# Patient Record
Sex: Male | Born: 1940 | Race: White | Hispanic: No | Marital: Married | Smoking: Former smoker
Health system: Southern US, Community
[De-identification: ages and names within clinical notes are randomized; demographics above are authoritative.]

## PROBLEM LIST (undated history)

## (undated) DIAGNOSIS — Z8669 Personal history of other diseases of the nervous system and sense organs: Secondary | ICD-10-CM

## (undated) DIAGNOSIS — K219 Gastro-esophageal reflux disease without esophagitis: Secondary | ICD-10-CM

## (undated) DIAGNOSIS — T4145XA Adverse effect of unspecified anesthetic, initial encounter: Secondary | ICD-10-CM

## (undated) DIAGNOSIS — G4730 Sleep apnea, unspecified: Secondary | ICD-10-CM

## (undated) DIAGNOSIS — G629 Polyneuropathy, unspecified: Secondary | ICD-10-CM

## (undated) DIAGNOSIS — T8859XA Other complications of anesthesia, initial encounter: Secondary | ICD-10-CM

## (undated) DIAGNOSIS — C801 Malignant (primary) neoplasm, unspecified: Secondary | ICD-10-CM

## (undated) DIAGNOSIS — E119 Type 2 diabetes mellitus without complications: Secondary | ICD-10-CM

## (undated) DIAGNOSIS — I2510 Atherosclerotic heart disease of native coronary artery without angina pectoris: Secondary | ICD-10-CM

## (undated) DIAGNOSIS — I214 Non-ST elevation (NSTEMI) myocardial infarction: Secondary | ICD-10-CM

## (undated) DIAGNOSIS — I10 Essential (primary) hypertension: Secondary | ICD-10-CM

## (undated) DIAGNOSIS — Z9889 Other specified postprocedural states: Secondary | ICD-10-CM

## (undated) DIAGNOSIS — M1990 Unspecified osteoarthritis, unspecified site: Secondary | ICD-10-CM

## (undated) HISTORY — PX: CHOLECYSTECTOMY: SHX55

## (undated) HISTORY — PX: CATARACT EXTRACTION: SUR2

## (undated) HISTORY — PX: LEG SURGERY: SHX1003

## (undated) HISTORY — PX: TOTAL KNEE ARTHROPLASTY: SHX125

## (undated) HISTORY — PX: CORONARY STENT PLACEMENT: SHX1402

---

## 2015-09-01 ENCOUNTER — Emergency Department (HOSPITAL_COMMUNITY): Payer: Medicare Other

## 2015-09-01 ENCOUNTER — Observation Stay (HOSPITAL_COMMUNITY)
Admission: EM | Admit: 2015-09-01 | Discharge: 2015-09-01 | Disposition: A | Discharge status: Home / Self Care | Payer: Medicare Other | Attending: Family Medicine | Admitting: Family Medicine

## 2015-09-01 ENCOUNTER — Encounter (HOSPITAL_COMMUNITY): Payer: Self-pay | Admitting: Emergency Medicine

## 2015-09-01 DIAGNOSIS — E118 Type 2 diabetes mellitus with unspecified complications: Secondary | ICD-10-CM

## 2015-09-01 DIAGNOSIS — I25119 Atherosclerotic heart disease of native coronary artery with unspecified angina pectoris: Secondary | ICD-10-CM | POA: Diagnosis not present

## 2015-09-01 DIAGNOSIS — I10 Essential (primary) hypertension: Secondary | ICD-10-CM

## 2015-09-01 DIAGNOSIS — R079 Chest pain, unspecified: Secondary | ICD-10-CM | POA: Diagnosis present

## 2015-09-01 DIAGNOSIS — E785 Hyperlipidemia, unspecified: Secondary | ICD-10-CM | POA: Diagnosis not present

## 2015-09-01 HISTORY — DX: Sleep apnea, unspecified: G47.30

## 2015-09-01 HISTORY — DX: Atherosclerotic heart disease of native coronary artery without angina pectoris: I25.10

## 2015-09-01 HISTORY — DX: Other complications of anesthesia, initial encounter: T88.59XA

## 2015-09-01 HISTORY — DX: Type 2 diabetes mellitus without complications: E11.9

## 2015-09-01 HISTORY — DX: Non-ST elevation (NSTEMI) myocardial infarction: I21.4

## 2015-09-01 HISTORY — DX: Gastro-esophageal reflux disease without esophagitis: K21.9

## 2015-09-01 HISTORY — DX: Personal history of other diseases of the nervous system and sense organs: Z98.890

## 2015-09-01 HISTORY — DX: Essential (primary) hypertension: I10

## 2015-09-01 HISTORY — DX: Unspecified osteoarthritis, unspecified site: M19.90

## 2015-09-01 HISTORY — DX: Personal history of other diseases of the nervous system and sense organs: Z86.69

## 2015-09-01 HISTORY — DX: Adverse effect of unspecified anesthetic, initial encounter: T41.45XA

## 2015-09-01 HISTORY — DX: Polyneuropathy, unspecified: G62.9

## 2015-09-01 HISTORY — DX: Malignant (primary) neoplasm, unspecified: C80.1

## 2015-09-01 LAB — CREATININE, SERUM: Creatinine, Ser: 0.87 mg/dL (ref 0.61–1.24)

## 2015-09-01 LAB — CBC
HCT: 41.2 % (ref 39.0–52.0)
HCT: 40.1 % (ref 39.0–52.0)
Hemoglobin: 15.2 g/dL (ref 13.0–17.0)
Hemoglobin: 14.3 g/dL (ref 13.0–17.0)
MCH: 33.8 pg (ref 26.0–34.0)
MCH: 32.9 pg (ref 26.0–34.0)
MCHC: 36.9 g/dL — ABNORMAL HIGH (ref 30.0–36.0)
MCHC: 35.7 g/dL (ref 30.0–36.0)
MCV: 91.6 fL (ref 78.0–100.0)
MCV: 92.4 fL (ref 78.0–100.0)
PLATELETS: 140 10*3/uL — AB (ref 150–400)
Platelets: 128 10*3/uL — ABNORMAL LOW (ref 150–400)
RBC: 4.5 MIL/uL (ref 4.22–5.81)
RBC: 4.34 MIL/uL (ref 4.22–5.81)
RDW: 12.4 % (ref 11.5–15.5)
RDW: 12.5 % (ref 11.5–15.5)
WBC: 5.8 10*3/uL (ref 4.0–10.5)
WBC: 5.6 10*3/uL (ref 4.0–10.5)

## 2015-09-01 LAB — BASIC METABOLIC PANEL
Anion gap: 11 (ref 5–15)
BUN: 15 mg/dL (ref 6–20)
CHLORIDE: 104 mmol/L (ref 101–111)
CO2: 22 mmol/L (ref 22–32)
CREATININE: 0.87 mg/dL (ref 0.61–1.24)
Calcium: 10 mg/dL (ref 8.9–10.3)
GFR calc Af Amer: >60 mL/min (ref >60)
GFR calc non Af Amer: >60 mL/min (ref >60)
Glucose, Bld: 239 mg/dL — ABNORMAL HIGH (ref 65–99)
Potassium: 4.1 mmol/L (ref 3.5–5.1)
SODIUM: 137 mmol/L (ref 135–145)

## 2015-09-01 LAB — TROPONIN I
Troponin I: <0.03 ng/mL (ref <0.031)
Troponin I: <0.03 ng/mL (ref <0.031)

## 2015-09-01 LAB — GLUCOSE, CAPILLARY
GLUCOSE-CAPILLARY: 264 mg/dL — AB (ref 65–99)
Glucose-Capillary: 261 mg/dL — ABNORMAL HIGH (ref 65–99)

## 2015-09-01 IMAGING — DX DG CHEST 2V
2 series · 2 of 2 positions shown · non-contrast
Comparison: None.

CLINICAL DATA: 74-year-old male with chest pain and shortness of
breath

EXAM:
CHEST  2 VIEW

[chest pa]
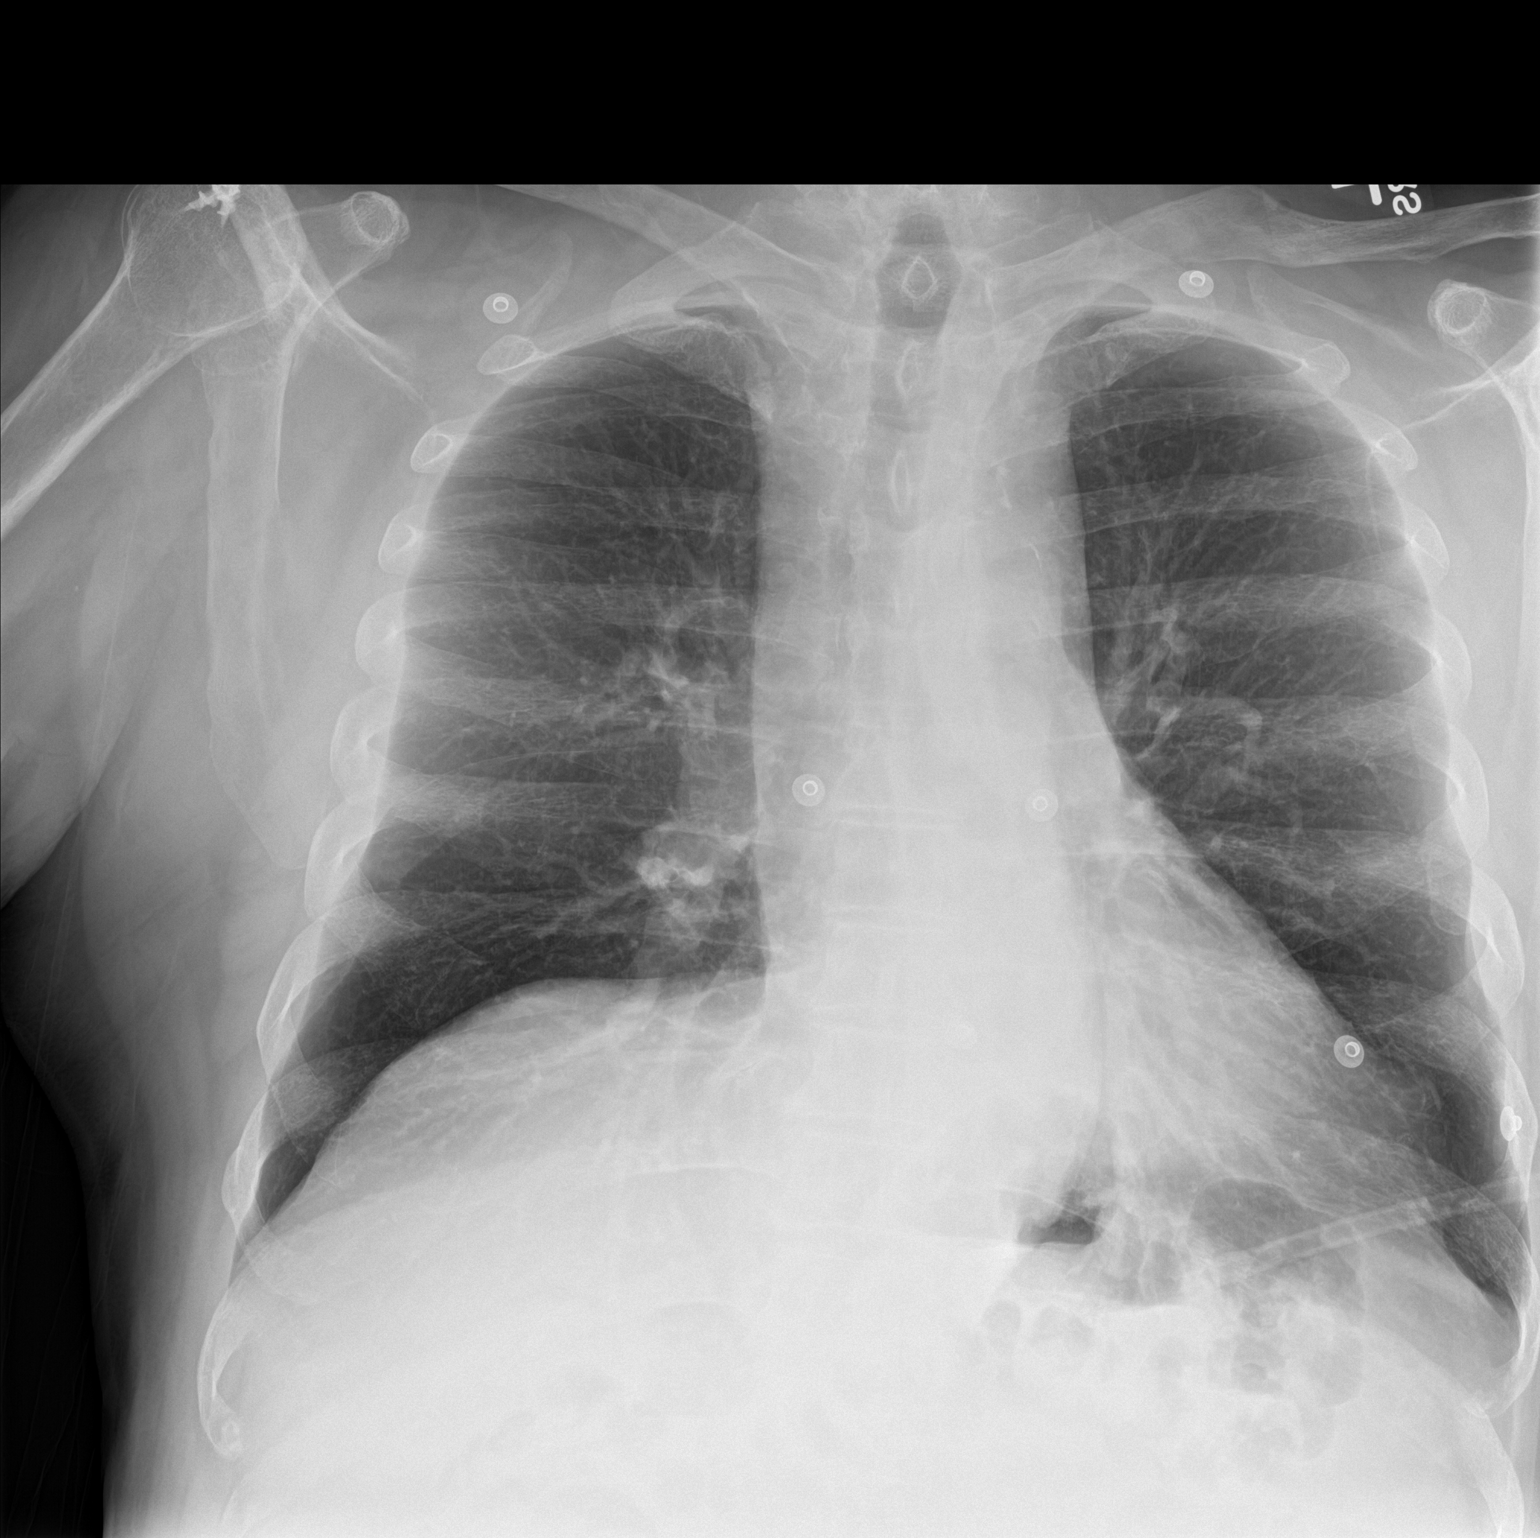

[chest lat]
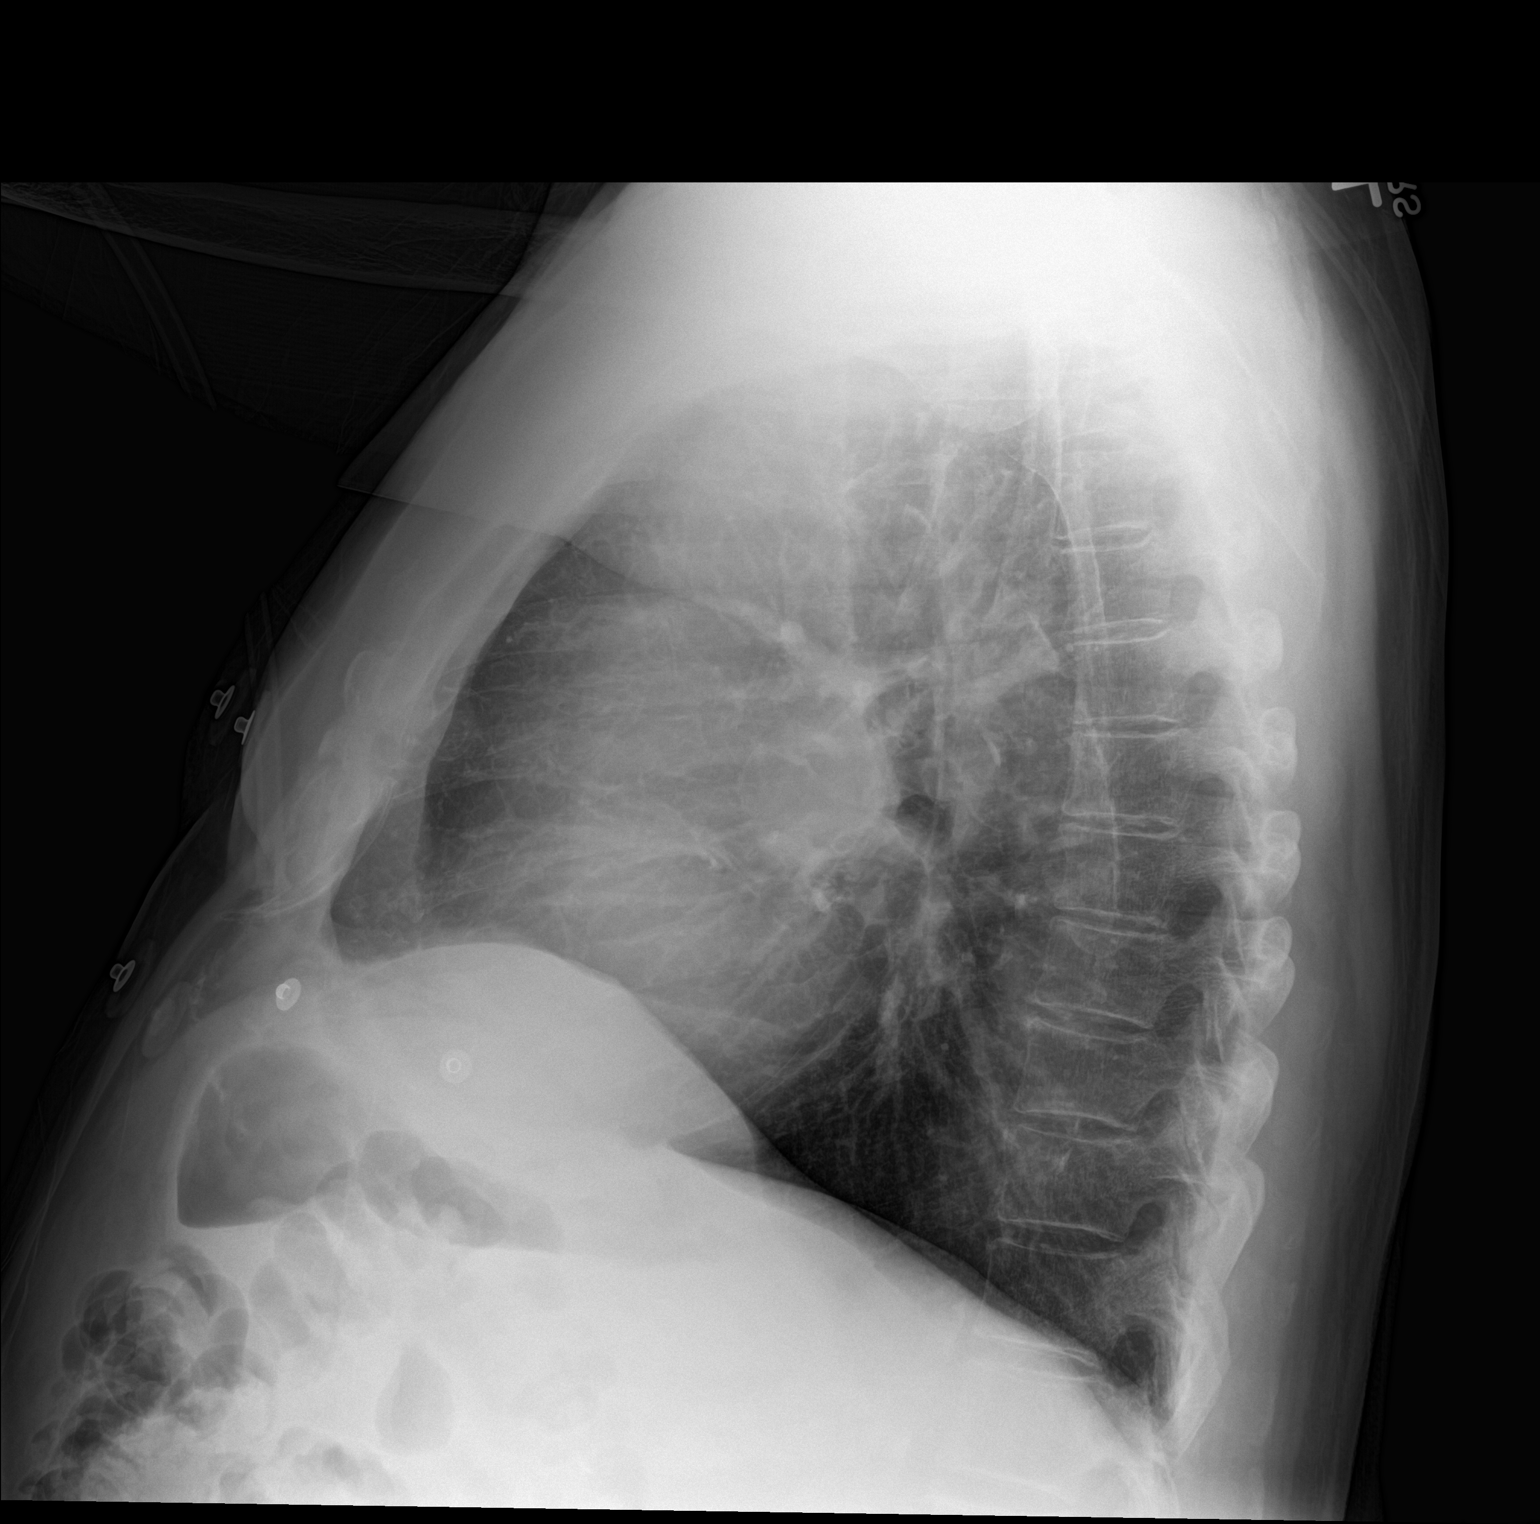

[2 of 2 positions shown; findings below may reference images not displayed]

FINDINGS: The heart size and mediastinal contours are within normal limits.
Both lungs are clear. The visualized skeletal structures are
unremarkable.
IMPRESSION: No active cardiopulmonary disease.

## 2015-09-01 MED ORDER — ZOLPIDEM TARTRATE 5 MG PO TABS: 5.0000 mg | ORAL_TABLET | Freq: Every evening | ORAL | Status: DC | PRN

## 2015-09-01 MED ORDER — ACETAMINOPHEN 325 MG PO TABS: 650.0000 mg | ORAL_TABLET | ORAL | Status: DC | PRN

## 2015-09-01 MED ORDER — LISINOPRIL 5 MG PO TABS
5.0000 mg | ORAL_TABLET | Freq: Every day | ORAL | Status: DC
  Administered 2015-09-01: 5 mg via ORAL
  Filled 2015-09-01: qty 1

## 2015-09-01 MED ORDER — ACETAMINOPHEN 650 MG RE SUPP: 650.0000 mg | Freq: Four times a day (QID) | RECTAL | Status: DC | PRN

## 2015-09-01 MED ORDER — SODIUM CHLORIDE 0.9 % IJ SOLN: 3.0000 mL | Freq: Two times a day (BID) | INTRAMUSCULAR | Status: DC

## 2015-09-01 MED ORDER — ONDANSETRON HCL 4 MG/2ML IJ SOLN: 4.0000 mg | Freq: Four times a day (QID) | INTRAMUSCULAR | Status: DC | PRN

## 2015-09-01 MED ORDER — SODIUM CHLORIDE 0.9 % IV SOLN: 250.0000 mL | INTRAVENOUS | Status: DC | PRN

## 2015-09-01 MED ORDER — ONDANSETRON HCL 4 MG PO TABS: 4.0000 mg | ORAL_TABLET | Freq: Four times a day (QID) | ORAL | Status: DC | PRN

## 2015-09-01 MED ORDER — ENOXAPARIN SODIUM 40 MG/0.4ML ~~LOC~~ SOLN
40.0000 mg | SUBCUTANEOUS | Status: DC
  Administered 2015-09-01: 40 mg via SUBCUTANEOUS
  Filled 2015-09-01: qty 0.4

## 2015-09-01 MED ORDER — FINASTERIDE 5 MG PO TABS
5.0000 mg | ORAL_TABLET | Freq: Every day | ORAL | Status: DC
  Administered 2015-09-01: 5 mg via ORAL
  Filled 2015-09-01: qty 1

## 2015-09-01 MED ORDER — ENOXAPARIN SODIUM 40 MG/0.4ML ~~LOC~~ SOLN: 40.0000 mg | SUBCUTANEOUS | Status: DC

## 2015-09-01 MED ORDER — ACETAMINOPHEN 325 MG PO TABS
650.0000 mg | ORAL_TABLET | Freq: Four times a day (QID) | ORAL | Status: DC | PRN
Start: 2015-09-01 — End: 2015-09-01

## 2015-09-01 MED ORDER — SODIUM CHLORIDE 0.9 % IJ SOLN
3.0000 mL | Freq: Two times a day (BID) | INTRAMUSCULAR | Status: DC
  Administered 2015-09-01: 3 mL via INTRAVENOUS

## 2015-09-01 MED ORDER — ALUM & MAG HYDROXIDE-SIMETH 200-200-20 MG/5ML PO SUSP: 30.0000 mL | Freq: Four times a day (QID) | ORAL | Status: DC | PRN

## 2015-09-01 MED ORDER — ASPIRIN EC 81 MG PO TBEC
81.0000 mg | DELAYED_RELEASE_TABLET | Freq: Every day | ORAL | Status: DC
  Administered 2015-09-01: 81 mg via ORAL
  Filled 2015-09-01: qty 1

## 2015-09-01 MED ORDER — INSULIN ASPART 100 UNIT/ML ~~LOC~~ SOLN
0.0000 [IU] | SUBCUTANEOUS | Status: DC
  Administered 2015-09-01: 12 [IU] via SUBCUTANEOUS

## 2015-09-01 MED ORDER — MORPHINE SULFATE (PF) 4 MG/ML IV SOLN
4.0000 mg | INTRAVENOUS | Status: DC | PRN
Start: 2015-09-01 — End: 2015-09-01

## 2015-09-01 MED ORDER — NITROGLYCERIN 0.4 MG SL SUBL: 0.4000 mg | SUBLINGUAL_TABLET | SUBLINGUAL | Status: DC | PRN

## 2015-09-01 MED ORDER — SODIUM CHLORIDE 0.9 % IJ SOLN: 3.0000 mL | INTRAMUSCULAR | Status: DC | PRN

## 2015-09-01 MED ORDER — CLOPIDOGREL BISULFATE 75 MG PO TABS
75.0000 mg | ORAL_TABLET | Freq: Every day | ORAL | Status: DC
  Administered 2015-09-01: 75 mg via ORAL
  Filled 2015-09-01: qty 1

## 2015-09-01 MED ORDER — DONEPEZIL HCL 10 MG PO TABS: 10.0000 mg | ORAL_TABLET | Freq: Every day | ORAL | Status: DC

## 2015-09-01 NOTE — Progress Notes (Signed)
Inpatient Diabetes Program Recommendations  AACE/ADA: New Consensus Statement on Inpatient Glycemic Control (2015)  Target Ranges:  Prepandial:   less than 140 mg/dL      Peak postprandial:   less than 180 mg/dL (1-2 hours)      Critically ill patients:  140 - 180 mg/dL   Results for ANDWELE, SIDDONS (MRN [id number]) as of 09/01/2015 13:46  Ref. Range 09/01/2015 11:36  Glucose-Capillary Latest Ref Range: 65-99 mg/dL 261 (H)   Review of Glycemic Control  Diabetes history: DM 2 Outpatient Diabetes medications: 75/25 60 units BID, Metformin 1,000 mg BID Current orders for Inpatient glycemic control: Novolog 0-24 units TID  Inpatient Diabetes Program Recommendations: Insulin - Basal: Patient is on 75/25 at home 60 units BID ( Equivalent basal  component 90 units, short acting component 30 units), Please consider ordering a portion of basal insulin while the patient is in the hospital, Lantus 45 units Q24 hours.  Please change Novolog correction scale from the cardiac surgery scale to the regular glycemic control order set and order Novolog Moderate correction TID with HS scale.  Thanks,  Tama Headings RN, MSN, [facility] Inpatient Diabetes Coordinator Team Pager [phone] (8a-5p)

## 2015-09-01 NOTE — ED Notes (Signed)
Per pt, he was woken out of a sleep w/ chest pressure (6-10 pain).  He took 650mg  ASA and 2 nitro w/ little relief.  No hx of MI, has had 2 stints placed.  Pressure is now rated at a 1 out of 10.

## 2015-09-01 NOTE — Plan of Care (Signed)
Problem: Safety: Goal: Ability to remain free from injury will improve Outcome: Completed/Met Date Met:  09/01/15 Pt educated on safety measures put into place. Pt verbalized understanding.

## 2015-09-01 NOTE — H&P (Signed)
Leslie Meadow, PA-C Physician Assistant Signed Internal Medicine Progress Notes 09/01/2015 7:29 AM    Expand All Collapse All    [facility] History and Physical  Leslie [name] [id number] DOB: December 02, 1940 DOA: 09/01/2015  Referring physician: Seward Speck PCP: Wallene Dales, MD   Chief Complaint: Chest pain  HPI: Leslie [name] is a 75 y.o. male. He reports awakening with chest discomfort. Pt describes pain as being a level 2 behind his sternum. Pt reports some soreness. Pt reports His wife gave him aspirin and he was given 2 nitroglycerin by EMS. Pt reports he is pain free. Pt reports nothing made pain worse. Pt has a history of coronary artery disease. He has 2 stents that were plced in [facility] in the past. He was last seen by his Cardiologist and had a chek up 1 year ago at [facility]. Pt has a history of hypertension and and diabtes.     Review of Systems:  Constitutional:  No weight loss, night sweats, Fevers, chills, fatigue.  HEENT:  No headaches, Difficulty swallowing,Tooth/dental problems,Sore throat,  No sneezing, itching, ear ache, nasal congestion, post nasal drip,  Cardio-vascular:  chest pain, Orthopnea, PND, swelling in lower extremities, anasarca, dizziness, palpitations  GI:  No heartburn, indigestion, abdominal pain, nausea, vomiting, diarrhea, change in bowel habits, loss of appetite  Resp:  No shortness of breath with exertion or at rest. No excess mucus, no productive cough, No non-productive cough, No coughing up of blood.No change in color of mucus.No wheezing.No chest wall deformity  Skin:  no rash or lesions.  GU:  no dysuria, change in color of urine, no urgency or frequency. No flank pain.  Musculoskeletal:  No joint pain or swelling. No decreased range of motion. No back pain.  Psych:  No change in mood or affect. No depression or anxiety. No memory loss.   Past Medical History  Diagnosis Date  .  Diabetes mellitus without complication ([place])   . Hypertension    History reviewed. No pertinent past surgical history. Social History:  reports that he quit smoking about 22 years ago. His smoking use included Cigarettes. He does not have any smokeless tobacco history on file. His alcohol and drug histories are not on file.  No Known Allergies  No family history on file.  Mother Stomach cancer Brother Testicular cancer.  Prior to Admission medications   Medication Sig Start Date End Date Taking? Authorizing Provider  aspirin EC 81 MG tablet Take 81 mg by mouth daily.   Yes Historical Provider, MD  ATORVASTATIN CALCIUM PO Take 1 tablet by mouth daily.   Yes Historical Provider, MD  clopidogrel (PLAVIX) 75 MG tablet Take 75 mg by mouth daily.   Yes Historical Provider, MD  donepezil (ARICEPT) 10 MG tablet Take 10 mg by mouth at bedtime. Reported on 09/01/2015   Yes Historical Provider, MD  finasteride (PROSCAR) 5 MG tablet Take 5 mg by mouth daily.   Yes Historical Provider, MD  insulin lispro protamine-lispro (HUMALOG 75/25 MIX) (75-25) 100 UNIT/ML SUSP injection Inject 60 Units into the skin 2 (two) times daily.   Yes Historical Provider, MD  lisinopril (PRINIVIL,ZESTRIL) 5 MG tablet Take 5 mg by mouth daily.   Yes Historical Provider, MD  metFORMIN (GLUCOPHAGE) 500 MG tablet Take 1,000 mg by mouth 2 (two) times daily with a meal.   Yes Historical Provider, MD  zaleplon (SONATA) 10 MG capsule Take 10 mg by mouth at bedtime.   Yes Historical Provider, MD   Physical Exam:  Filed Vitals:   09/01/15 0600 09/01/15 0615 09/01/15 0630 09/01/15 0645  BP: 132/74 141/67 137/68 121/74  Pulse: 73 71 63 68  Temp:      TempSrc:      Resp: 17 15 14 18   Height:      Weight:      SpO2: 97% 98% 98% 97%    Wt Readings from Last 3 Encounters:  09/01/15 200 lb (90.719 kg)      General: Appears calm and comfortable  Eyes: PERRL, normal lids, irises & conjunctiva  ENT: grossly normal hearing, lips & tongue  Neck: no LAD, masses or thyromegaly  Cardiovascular: RRR, no m/r/g. No LE edema.  Telemetry: SR, no arrhythmias   Respiratory: CTA bilaterally, no w/r/r. Normal respiratory effort.  Abdomen: soft, ntnd  Skin: no rash or induration seen on limited exam  Musculoskeletal: grossly normal tone BUE/BLE  Psychiatric: grossly normal mood and affect, speech fluent and appropriate  Neurologic: grossly non-focal.          Labs on Admission:  Basic Metabolic Panel:  Last Labs      Recent Labs Lab 09/01/15 0503  NA 137  K 4.1  CL 104  CO2 22  GLUCOSE 239*  BUN 15  CREATININE 0.87  CALCIUM 10.0     Liver Function Tests:  Last Labs     No results for input(s): AST, ALT, ALKPHOS, BILITOT, PROT, ALBUMIN in the last 168 hours.    Last Labs     No results for input(s): LIPASE, AMYLASE in the last 168 hours.    Last Labs     No results for input(s): AMMONIA in the last 168 hours.   CBC:  Last Labs      Recent Labs Lab 09/01/15 0503  WBC 5.8  HGB 15.2  HCT 41.2  MCV 91.6  PLT 140*     Cardiac Enzymes:  Last Labs      Recent Labs Lab 09/01/15 0503  TROPONINI <0.03      BNP (last 3 results)  Recent Labs (within last 365 days)    No results for input(s): BNP in the last 8760 hours.    ProBNP (last 3 results)  Recent Labs (within last 365 days)    No results for input(s): PROBNP in the last 8760 hours.    CBG:  Last Labs     No results for input(s): GLUCAP in the last 168 hours.    Radiological Exams on Admission:  Imaging Results (Last 48 hours)    Dg Chest 2 View  09/01/2015 CLINICAL DATA: 75 year old male with chest pain and shortness of breath EXAM: CHEST 2 VIEW COMPARISON: None. FINDINGS: The heart size and mediastinal contours are within normal limits. Both  lungs are clear. The visualized skeletal structures are unremarkable. IMPRESSION: No active cardiopulmonary disease. Electronically Signed By: Anner Crete M.D. On: 09/01/2015 06:05     EKG: Independently reviewed.   Assessment/Plan Active Problems:  Pain in the chest   1. Chest Pain. Pt's heart score is 4.  EKg without evidence of acs. Troponin normal Chest xray non acute  Follow troponins to rule out MI. Pt is currently pain free. Pt is followed by Cardiology in [place].  2. Hypertension continue lisinopril. Monitor blood pressue 3. Diabetes. Continue home 75/25  4. Elevated cholesterol,  Continue current medications.   code Status: no code  DVT Prophylaxis:lovenox Family Communication: wife at bedside Plan admit for evaluation  Time spent: [facility] [phone]  Amion  7p-7a Password [email or web address]

## 2015-09-01 NOTE — ED Provider Notes (Signed)
CSN: [id number]     Arrival date & time 09/01/15  0434 History   First MD Initiated Contact with Patient 09/01/15 [phone]     Chief Complaint  Patient presents with  . Chest Pain     (Consider location/radiation/quality/duration/timing/severity/associated sxs/prior Treatment) The history is provided by the patient.   75 year old was awakened by retrosternal chest pressure. There is no associated dyspnea, nausea, diaphoresis. He does have a history of having 2 coronary stents placed but cannot recall if what he had tonight was similar to what he had previously. He took 2 aspirin at home and called for name brand. He thinks EMS made given him some nitroglycerin but is not sure. He tells me his pressure feeling was rated 3/10 at home and 1/10 currently. Nothing seems to make it better nothing seems to make it worse.  No past medical history on file. No past surgical history on file. No family history on file. Social History  Substance Use Topics  . Smoking status: Not on file  . Smokeless tobacco: Not on file  . Alcohol Use: Not on file    Review of Systems  All other systems reviewed and are negative.     Allergies  Review of patient's allergies indicates not on file.  Home Medications   Prior to Admission medications   Not on File   BP 138/64 mmHg  Pulse 68  Temp(Src) 98 F (36.7 C) (Oral)  Resp 18  Ht 5\' 6"  (1.676 m)  Wt 200 lb (90.719 kg)  BMI 32.30 kg/m2  SpO2 100% Physical Exam  Nursing note and vitals reviewed.  75 year old male, resting comfortably and in no acute distress. Vital signs are normal. Oxygen saturation is 100%, which is normal. Head is normocephalic and atraumatic. PERRLA, EOMI. Oropharynx is clear. Neck is nontender and supple without adenopathy or JVD. Back is nontender and there is no CVA tenderness. Lungs are clear without rales, wheezes, or rhonchi. Chest is nontender. Heart has regular rate and rhythm without murmur. Abdomen is soft, flat,  nontender without masses or hepatosplenomegaly and peristalsis is normoactive. Extremities have no cyanosis or edema, full range of motion is present. Skin is warm and dry without rash. Neurologic: Mental status is normal, cranial nerves are intact, there are no motor or sensory deficits.  ED Course  Procedures (including critical care time) Labs Review Results for orders placed or performed during the hospital encounter of [id number]  Basic metabolic panel  Result Value Ref Range   Sodium 137 135 - 145 mmol/L   Potassium 4.1 3.5 - 5.1 mmol/L   Chloride 104 101 - 111 mmol/L   CO2 22 22 - 32 mmol/L   Glucose, Bld 239 (H) 65 - 99 mg/dL   BUN 15 6 - 20 mg/dL   Creatinine, Ser 0.87 0.61 - 1.24 mg/dL   Calcium 10.0 8.9 - 10.3 mg/dL   GFR calc non Af Amer >60 >60 mL/min   GFR calc Af Amer >60 >60 mL/min   Anion gap 11 5 - 15  CBC  Result Value Ref Range   WBC 5.8 4.0 - 10.5 K/uL   RBC 4.50 4.22 - 5.81 MIL/uL   Hemoglobin 15.2 13.0 - 17.0 g/dL   HCT 41.2 39.0 - 52.0 %   MCV 91.6 78.0 - 100.0 fL   MCH 33.8 26.0 - 34.0 pg   MCHC 36.9 (H) 30.0 - 36.0 g/dL   RDW 12.4 11.5 - 15.5 %   Platelets 140 (L) 150 -  400 K/uL  Troponin I  Result Value Ref Range   Troponin I <0.03 <0.031 ng/mL   Imaging Review Dg Chest 2 View  09/01/2015  CLINICAL DATA:  75 year old male with chest pain and shortness of breath EXAM: CHEST  2 VIEW COMPARISON:  None. FINDINGS: The heart size and mediastinal contours are within normal limits. Both lungs are clear. The visualized skeletal structures are unremarkable. IMPRESSION: No active cardiopulmonary disease. Electronically Signed   By: Anner Crete M.D.   On: 09/01/2015 06:05   I have personally reviewed and evaluated these images and lab results as part of my medical decision-making.   EKG Interpretation   Date/Time:  Wednesday September 01 2015 04:43:30 EST Ventricular Rate:  63 PR Interval:  172 QRS Duration: 72 QT Interval:  404 QTC Calculation:  413 R Axis:   -70 Text Interpretation:  Normal sinus rhythm Left axis deviation Low voltage  QRS Inferior infarct , age undetermined Cannot rule out Anterior infarct ,  age undetermined Abnormal ECG No old tracing to compare Confirmed by [facility]   MD, Brigitta Pricer ([id number]) on 09/01/2015 4:50:14 AM      MDM   Final diagnoses:  Chest pain, unspecified  Coronary artery disease involving native coronary artery of native heart with angina pectoris (HCC)    Chest discomfort which may be cardiac in origin. He he has no prior records and the [facility] system. I was unable to find records for his stents in care everywhere.  Chest discomfort has completely resolved. Troponin and chest x-ray are unremarkable. Given his history of stent placement and nocturnal pain, he will be admitted for serial troponins. Case is discussed with Dr. Marily Memos of [facility] who agrees to admit the patient.  Delora Fuel, MD [id number] [phone]

## 2015-09-01 NOTE — Progress Notes (Signed)
[facility] History and Physical  Leslie [name] [id number] DOB: [birth year] DOA: 09/01/2015  Referring physician:  Seward Speck PCP: Wallene Dales, MD   Chief Complaint: Chest pain  HPI: Leslie [name] is a 75 y.o. male. He reports awakening with chest discomfort.  Pt describes pain as being a level 2 behind his sternum. Pt reports some soreness.  Pt reports His wife gave him aspirin and he was given 2 nitroglycerin by EMS.   Pt reports he is pain free. Pt reports nothing made pain worse. Pt has a history of coronary artery disease.  He has 2 stents that were plced in [facility] in the past.  He was last seen by his Cardiologist and had a chek up 1 year ago at [facility]. Pt has a history of hypertension and and diabtes.     Review of Systems:  Constitutional:  No weight loss, night sweats, Fevers, chills, fatigue.  HEENT:  No headaches, Difficulty swallowing,Tooth/dental problems,Sore throat,  No sneezing, itching, ear ache, nasal congestion, post nasal drip,  Cardio-vascular:   chest pain, Orthopnea, PND, swelling in lower extremities, anasarca, dizziness, palpitations  GI:  No heartburn, indigestion, abdominal pain, nausea, vomiting, diarrhea, change in bowel habits, loss of appetite  Resp:  No shortness of breath with exertion or at rest. No excess mucus, no productive cough, No non-productive cough, No coughing up of blood.No change in color of mucus.No wheezing.No chest wall deformity  Skin:  no rash or lesions.  GU:  no dysuria, change in color of urine, no urgency or frequency. No flank pain.  Musculoskeletal:  No joint pain or swelling. No decreased range of motion. No back pain.  Psych:  No change in mood or affect. No depression or anxiety. No memory loss.   Past Medical History  Diagnosis Date  . Diabetes mellitus without complication ([place])   . Hypertension    History reviewed. No pertinent past surgical history. Social History:  reports that he quit  smoking about 22 years ago. His smoking use included Cigarettes. He does not have any smokeless tobacco history on file. His alcohol and drug histories are not on file.  No Known Allergies  No family history on file.  Mother  Stomach cancer Brother Testicular cancer.  Prior to Admission medications   Medication Sig Start Date End Date Taking? Authorizing Provider  aspirin EC 81 MG tablet Take 81 mg by mouth daily.   Yes Historical Provider, MD  ATORVASTATIN CALCIUM PO Take 1 tablet by mouth daily.   Yes Historical Provider, MD  clopidogrel (PLAVIX) 75 MG tablet Take 75 mg by mouth daily.   Yes Historical Provider, MD  donepezil (ARICEPT) 10 MG tablet Take 10 mg by mouth at bedtime. Reported on 09/01/2015   Yes Historical Provider, MD  finasteride (PROSCAR) 5 MG tablet Take 5 mg by mouth daily.   Yes Historical Provider, MD  insulin lispro protamine-lispro (HUMALOG 75/25 MIX) (75-25) 100 UNIT/ML SUSP injection Inject 60 Units into the skin 2 (two) times daily.   Yes Historical Provider, MD  lisinopril (PRINIVIL,ZESTRIL) 5 MG tablet Take 5 mg by mouth daily.   Yes Historical Provider, MD  metFORMIN (GLUCOPHAGE) 500 MG tablet Take 1,000 mg by mouth 2 (two) times daily with a meal.   Yes Historical Provider, MD  zaleplon (SONATA) 10 MG capsule Take 10 mg by mouth at bedtime.   Yes Historical Provider, MD   Physical Exam: Filed Vitals:   09/01/15 0600 09/01/15 0615 09/01/15 0630 09/01/15 0645  BP: 132/74  141/67 137/68 121/74  Pulse: 73 71 63 68  Temp:      TempSrc:      Resp: 17 15 14 18   Height:      Weight:      SpO2: 97% 98% 98% 97%    Wt Readings from Last 3 Encounters:  09/01/15 200 lb (90.719 kg)    General:  Appears calm and comfortable Eyes: PERRL, normal lids, irises & conjunctiva ENT: grossly normal hearing, lips & tongue Neck: no LAD, masses or thyromegaly Cardiovascular: RRR, no m/r/g. No LE edema. Telemetry: SR, no arrhythmias  Respiratory: CTA bilaterally, no w/r/r.  Normal respiratory effort. Abdomen: soft, ntnd Skin: no rash or induration seen on limited exam Musculoskeletal: grossly normal tone BUE/BLE Psychiatric: grossly normal mood and affect, speech fluent and appropriate Neurologic: grossly non-focal.          Labs on Admission:  Basic Metabolic Panel:  Recent Labs Lab 09/01/15 0503  NA 137  K 4.1  CL 104  CO2 22  GLUCOSE 239*  BUN 15  CREATININE 0.87  CALCIUM 10.0   Liver Function Tests: No results for input(s): AST, ALT, ALKPHOS, BILITOT, PROT, ALBUMIN in the last 168 hours. No results for input(s): LIPASE, AMYLASE in the last 168 hours. No results for input(s): AMMONIA in the last 168 hours. CBC:  Recent Labs Lab 09/01/15 0503  WBC 5.8  HGB 15.2  HCT 41.2  MCV 91.6  PLT 140*   Cardiac Enzymes:  Recent Labs Lab 09/01/15 0503  TROPONINI <0.03    BNP (last 3 results) No results for input(s): BNP in the last 8760 hours.  ProBNP (last 3 results) No results for input(s): PROBNP in the last 8760 hours.  CBG: No results for input(s): GLUCAP in the last 168 hours.  Radiological Exams on Admission: Dg Chest 2 View  09/01/2015  CLINICAL DATA:  75 year old male with chest pain and shortness of breath EXAM: CHEST  2 VIEW COMPARISON:  None. FINDINGS: The heart size and mediastinal contours are within normal limits. Both lungs are clear. The visualized skeletal structures are unremarkable. IMPRESSION: No active cardiopulmonary disease. Electronically Signed   By: Anner Crete M.D.   On: 09/01/2015 06:05    EKG: Independently reviewed.   Assessment/Plan Active Problems:   Pain in the chest   1. Chest Pain.   Follow troponins to rule out MI.  Pt is currently pain free. Pt is followed by Cardiology in [place].  2. Hypertension continue lisinopril. Monitor blood pressue 3. Diabetes. SSI.   code Status: full DVT Prophylaxis:lovenox Family Communication: wife at bedside Plan admit for evaluation  Time spent:  [facility] [phone]  Amion 7p-7a  [place]

## 2015-09-01 NOTE — Discharge Summary (Signed)
Physician Discharge Summary  Leslie [name] [id number] DOB: 23-Mar-1941 DOA: 09/01/2015  PCP: Wallene Dales, MD  Admit date: 09/01/2015 Discharge date: 09/01/2015   Recommendations for Outpatient Follow-up:  PCP/Cardiology   Discharge Diagnoses:  Active Problems:   Pain in the chest   Chest pain   Hypertension   Diabetes mellitus with complication (HCC)   HLD (hyperlipidemia)   Discharge Condition:  Atypical Angina  Diet recommendation: non  Filed Weights   09/01/15 0450 09/01/15 0931  Weight: 90.719 kg (200 lb) 85.7 kg (188 lb 15 oz)    History of present illness:  HPI: Leslie [name] is a 75 y.o. male. He reports awakening with chest discomfort. Pt describes pain as being a level 2 behind his sternum. Pt reports some soreness. Pt reports His wife gave him aspirin and he was given 2 nitroglycerin by EMS. Pt reports he is pain free. Pt reports nothing made pain worse. Pt has a history of coronary artery disease. He has 2 stents that were plced in [facility] in the past. He was last seen by his Cardiologist and had a chek up 1 year ago at [facility]. Pt has a history of hypertension and and diabtes.   Hospital Course:  Pt was admitted overnight on 09/01/15 for CP. Cardiac enzymes were cycled x3 and were all negative. Pt monitored on telemetry w/o event. EKG w/o evidence of ischemia. CP resolved by time of admission. Suspect MSK vs GI etiology.   Procedures: none Consultations:  none  Discharge Exam: Filed Vitals:   09/01/15 1436 09/01/15 1529  BP: 128/72 135/63  Pulse: 64 65  Temp: 98.1 F (36.7 C) 98.5 F (36.9 C)  Resp: 18 18    General: WNWD no distress Cardiovascular: RRR, no m/r/g Respiratory: CTAB, nml effort  Discharge Instructions    Discharge Medication List as of 09/01/2015  6:56 PM    CONTINUE these medications which have NOT CHANGED   Details  aspirin EC 81 MG tablet Take 81 mg by mouth daily., Until Discontinued, Historical Med     atorvastatin (LIPITOR) 80 MG tablet Take 80 mg by mouth daily at 6 PM., Until Discontinued, Historical Med    clopidogrel (PLAVIX) 75 MG tablet Take 75 mg by mouth daily., Until Discontinued, Historical Med    donepezil (ARICEPT) 10 MG tablet Take 10 mg by mouth daily. Reported on 09/01/2015, Until Discontinued, Historical Med    finasteride (PROSCAR) 5 MG tablet Take 5 mg by mouth daily., Until Discontinued, Historical Med    lisinopril (PRINIVIL,ZESTRIL) 5 MG tablet Take 5 mg by mouth daily., Until Discontinued, Historical Med    metFORMIN (GLUCOPHAGE) 500 MG tablet Take 1,000 mg by mouth 2 (two) times daily with a meal., Until Discontinued, Historical Med      STOP taking these medications     zaleplon (SONATA) 10 MG capsule        No Known Allergies    The results of significant diagnostics from this hospitalization (including imaging, microbiology, ancillary and laboratory) are listed below for reference.    Significant Diagnostic Studies: Dg Chest 2 View  09/01/2015  CLINICAL DATA:  75 year old male with chest pain and shortness of breath EXAM: CHEST  2 VIEW COMPARISON:  None. FINDINGS: The heart size and mediastinal contours are within normal limits. Both lungs are clear. The visualized skeletal structures are unremarkable. IMPRESSION: No active cardiopulmonary disease. Electronically Signed   By: Anner Crete M.D.   On: 09/01/2015 06:05    Microbiology: No results found for this  or any previous visit (from the past 240 hour(s)).   Labs: Basic Metabolic Panel:  Recent Labs Lab 09/01/15 0503 09/01/15 0842  NA 137  --   K 4.1  --   CL 104  --   CO2 22  --   GLUCOSE 239*  --   BUN 15  --   CREATININE 0.87 0.87  CALCIUM 10.0  --    Liver Function Tests: No results for input(s): AST, ALT, ALKPHOS, BILITOT, PROT, ALBUMIN in the last 168 hours. No results for input(s): LIPASE, AMYLASE in the last 168 hours. No results for input(s): AMMONIA in the last 168  hours. CBC:  Recent Labs Lab 09/01/15 0503 09/01/15 0842  WBC 5.8 5.6  HGB 15.2 14.3  HCT 41.2 40.1  MCV 91.6 92.4  PLT 140* 128*   Cardiac Enzymes:  Recent Labs Lab 09/01/15 0503 09/01/15 0842 09/01/15 1510  TROPONINI <0.03 <0.03 <0.03   BNP: BNP (last 3 results) No results for input(s): BNP in the last 8760 hours.  ProBNP (last 3 results) No results for input(s): PROBNP in the last 8760 hours.  CBG:  Recent Labs Lab 09/01/15 1136 09/01/15 1700  GLUCAP 261* 264*       Signed:  Mareli Antunes J MD  FACP  [facility] 09/01/2015, 10:55 PM

## 2015-09-01 NOTE — Discharge Instructions (Signed)
The cause of your chest pain is likely not related to your heart. You blood work and her EKG was normal. Your blood glucose was elevated so please continue your home regimen to treat your diabetes. Please continue all of your other home medications as prescribed except for the Zaleplon. Please follow up with your primary care doctor in the next 2 weeks.   Chest Pain Observation It is often hard to give a specific diagnosis for the cause of chest pain. Among other possibilities your symptoms might be caused by inadequate oxygen delivery to your heart (angina). Angina that is not treated or evaluated can lead to a heart attack (myocardial infarction) or death. Blood tests, electrocardiograms, and X-rays may have been done to help determine a possible cause of your chest pain. After evaluation and observation, your health care provider has determined that it is unlikely your pain was caused by an unstable condition that requires hospitalization. However, a full evaluation of your pain may need to be completed, with additional diagnostic testing as directed. It is very important to keep your follow-up appointments. Not keeping your follow-up appointments could result in permanent heart damage, disability, or death. If there is any problem keeping your follow-up appointments, you must call your health care provider. HOME CARE INSTRUCTIONS  Due to the slight chance that your pain could be angina, it is important to follow your health care provider's treatment plan and also maintain a healthy lifestyle:  Maintain or work toward achieving a healthy weight.  Stay physically active and exercise regularly.  Decrease your salt intake.  Eat a balanced, healthy diet. Talk to a dietitian to learn about heart-healthy foods.  Increase your fiber intake by including whole grains, vegetables, fruits, and nuts in your diet.  Avoid situations that cause stress, anger, or depression.  Take medicines as advised by your  health care provider. Report any side effects to your health care provider. Do not stop medicines or adjust the dosages on your own.  Quit smoking. Do not use nicotine patches or gum until you check with your health care provider.  Keep your blood pressure, blood sugar, and cholesterol levels within normal limits.  Limit alcohol intake to no more than 1 drink per day for women who are not pregnant and 2 drinks per day for men.  Do not abuse drugs. SEEK IMMEDIATE MEDICAL CARE IF: You have severe chest pain or pressure which may include symptoms such as:  You feel pain or pressure in your arms, neck, jaw, or back.  You have severe back or abdominal pain, feel sick to your stomach (nauseous), or throw up (vomit).  You are sweating profusely.  You are having a fast or irregular heartbeat.  You feel short of breath while at rest.  You notice increasing shortness of breath during rest, sleep, or with activity.  You have chest pain that does not get better after rest or after taking your usual medicine.  You wake from sleep with chest pain.  You are unable to sleep because you cannot breathe.  You develop a frequent cough or you are coughing up blood.  You feel dizzy, faint, or experience extreme fatigue.  You develop severe weakness, dizziness, fainting, or chills. Any of these symptoms may represent a serious problem that is an emergency. Do not wait to see if the symptoms will go away. Call your local emergency services (911 in the [place] Do not drive yourself to the hospital. MAKE SURE YOU:  Understand these instructions.  Will watch your condition.  Will get help right away if you are not doing well or get worse.   This information is not intended to replace advice given to you by your health care provider. Make sure you discuss any questions you have with your health care provider.   Document Released: 09/09/2010 Document Revised: 08/12/2013 Document Reviewed:  02/06/2013 Elsevier Interactive Patient Education [employer].

## 2021-03-16 ENCOUNTER — Emergency Department (HOSPITAL_COMMUNITY): Payer: Medicare HMO

## 2021-03-16 ENCOUNTER — Inpatient Hospital Stay (HOSPITAL_COMMUNITY): Payer: Medicare HMO

## 2021-03-16 ENCOUNTER — Encounter (HOSPITAL_COMMUNITY): Payer: Self-pay

## 2021-03-16 ENCOUNTER — Inpatient Hospital Stay (HOSPITAL_COMMUNITY)
Admission: EM | Admit: 2021-03-16 | Discharge: 2021-03-24 | DRG: 682 | Disposition: A | Discharge status: Skilled Nursing Facility | Payer: Medicare HMO | Attending: Internal Medicine | Admitting: Internal Medicine

## 2021-03-16 DIAGNOSIS — R414 Neurologic neglect syndrome: Secondary | ICD-10-CM | POA: Diagnosis not present

## 2021-03-16 DIAGNOSIS — K219 Gastro-esophageal reflux disease without esophagitis: Secondary | ICD-10-CM | POA: Diagnosis present

## 2021-03-16 DIAGNOSIS — IMO0002 Reserved for concepts with insufficient information to code with codable children: Secondary | ICD-10-CM

## 2021-03-16 DIAGNOSIS — Z955 Presence of coronary angioplasty implant and graft: Secondary | ICD-10-CM | POA: Diagnosis not present

## 2021-03-16 DIAGNOSIS — N179 Acute kidney failure, unspecified: Principal | ICD-10-CM | POA: Diagnosis present

## 2021-03-16 DIAGNOSIS — E785 Hyperlipidemia, unspecified: Secondary | ICD-10-CM | POA: Diagnosis not present

## 2021-03-16 DIAGNOSIS — R410 Disorientation, unspecified: Secondary | ICD-10-CM | POA: Diagnosis present

## 2021-03-16 DIAGNOSIS — Z7984 Long term (current) use of oral hypoglycemic drugs: Secondary | ICD-10-CM

## 2021-03-16 DIAGNOSIS — E1140 Type 2 diabetes mellitus with diabetic neuropathy, unspecified: Secondary | ICD-10-CM | POA: Diagnosis not present

## 2021-03-16 DIAGNOSIS — F0280 Dementia in other diseases classified elsewhere without behavioral disturbance: Secondary | ICD-10-CM | POA: Diagnosis not present

## 2021-03-16 DIAGNOSIS — I10 Essential (primary) hypertension: Secondary | ICD-10-CM | POA: Diagnosis present

## 2021-03-16 DIAGNOSIS — G252 Other specified forms of tremor: Secondary | ICD-10-CM | POA: Diagnosis present

## 2021-03-16 DIAGNOSIS — F0390 Unspecified dementia without behavioral disturbance: Secondary | ICD-10-CM | POA: Diagnosis present

## 2021-03-16 DIAGNOSIS — N281 Cyst of kidney, acquired: Secondary | ICD-10-CM | POA: Diagnosis present

## 2021-03-16 DIAGNOSIS — N1330 Unspecified hydronephrosis: Secondary | ICD-10-CM | POA: Diagnosis present

## 2021-03-16 DIAGNOSIS — Z7902 Long term (current) use of antithrombotics/antiplatelets: Secondary | ICD-10-CM

## 2021-03-16 DIAGNOSIS — H53461 Homonymous bilateral field defects, right side: Secondary | ICD-10-CM | POA: Diagnosis present

## 2021-03-16 DIAGNOSIS — E871 Hypo-osmolality and hyponatremia: Secondary | ICD-10-CM | POA: Diagnosis not present

## 2021-03-16 DIAGNOSIS — G459 Transient cerebral ischemic attack, unspecified: Secondary | ICD-10-CM | POA: Diagnosis not present

## 2021-03-16 DIAGNOSIS — I2510 Atherosclerotic heart disease of native coronary artery without angina pectoris: Secondary | ICD-10-CM | POA: Diagnosis present

## 2021-03-16 DIAGNOSIS — Z96651 Presence of right artificial knee joint: Secondary | ICD-10-CM | POA: Diagnosis present

## 2021-03-16 DIAGNOSIS — R29702 NIHSS score 2: Secondary | ICD-10-CM | POA: Diagnosis present

## 2021-03-16 DIAGNOSIS — I252 Old myocardial infarction: Secondary | ICD-10-CM

## 2021-03-16 DIAGNOSIS — Z87891 Personal history of nicotine dependence: Secondary | ICD-10-CM

## 2021-03-16 DIAGNOSIS — Z8546 Personal history of malignant neoplasm of prostate: Secondary | ICD-10-CM

## 2021-03-16 DIAGNOSIS — E876 Hypokalemia: Secondary | ICD-10-CM | POA: Diagnosis present

## 2021-03-16 DIAGNOSIS — Z79899 Other long term (current) drug therapy: Secondary | ICD-10-CM | POA: Diagnosis not present

## 2021-03-16 DIAGNOSIS — E861 Hypovolemia: Secondary | ICD-10-CM | POA: Diagnosis present

## 2021-03-16 DIAGNOSIS — F0281 Dementia in other diseases classified elsewhere with behavioral disturbance: Secondary | ICD-10-CM | POA: Diagnosis not present

## 2021-03-16 DIAGNOSIS — G9341 Metabolic encephalopathy: Secondary | ICD-10-CM | POA: Diagnosis present

## 2021-03-16 DIAGNOSIS — Z7982 Long term (current) use of aspirin: Secondary | ICD-10-CM

## 2021-03-16 DIAGNOSIS — R29810 Facial weakness: Secondary | ICD-10-CM | POA: Diagnosis present

## 2021-03-16 DIAGNOSIS — E1165 Type 2 diabetes mellitus with hyperglycemia: Secondary | ICD-10-CM | POA: Diagnosis not present

## 2021-03-16 DIAGNOSIS — E860 Dehydration: Secondary | ICD-10-CM | POA: Diagnosis not present

## 2021-03-16 DIAGNOSIS — G309 Alzheimer's disease, unspecified: Secondary | ICD-10-CM | POA: Diagnosis not present

## 2021-03-16 DIAGNOSIS — Z20822 Contact with and (suspected) exposure to covid-19: Secondary | ICD-10-CM | POA: Diagnosis not present

## 2021-03-16 LAB — DIFFERENTIAL
Abs Immature Granulocytes: 0.11 10*3/uL — ABNORMAL HIGH (ref 0.00–0.07)
Basophils Absolute: 0 10*3/uL (ref 0.0–0.1)
Basophils Relative: 0 %
Eosinophils Absolute: 0 10*3/uL (ref 0.0–0.5)
Eosinophils Relative: 0 %
Immature Granulocytes: 1 %
Lymphocytes Relative: 3 %
Lymphs Abs: 0.3 10*3/uL — ABNORMAL LOW (ref 0.7–4.0)
Monocytes Absolute: 0.7 10*3/uL (ref 0.1–1.0)
Monocytes Relative: 8 %
Neutro Abs: 8.2 10*3/uL — ABNORMAL HIGH (ref 1.7–7.7)
Neutrophils Relative %: 88 %

## 2021-03-16 LAB — COMPREHENSIVE METABOLIC PANEL
ALT: 18 U/L (ref 0–44)
AST: 17 U/L (ref 15–41)
Albumin: 3.2 g/dL — ABNORMAL LOW (ref 3.5–5.0)
Alkaline Phosphatase: 54 U/L (ref 38–126)
Anion gap: 11 (ref 5–15)
BUN: 47 mg/dL — ABNORMAL HIGH (ref 8–23)
CO2: 18 mmol/L — ABNORMAL LOW (ref 22–32)
Calcium: 8.8 mg/dL — ABNORMAL LOW (ref 8.9–10.3)
Chloride: 102 mmol/L (ref 98–111)
Creatinine, Ser: 3.2 mg/dL — ABNORMAL HIGH (ref 0.61–1.24)
GFR, Estimated: 19 mL/min — ABNORMAL LOW (ref >60)
Glucose, Bld: 359 mg/dL — ABNORMAL HIGH (ref 70–99)
Potassium: 4 mmol/L (ref 3.5–5.1)
Sodium: 131 mmol/L — ABNORMAL LOW (ref 135–145)
Total Bilirubin: 1.1 mg/dL (ref 0.3–1.2)
Total Protein: 6.1 g/dL — ABNORMAL LOW (ref 6.5–8.1)

## 2021-03-16 LAB — I-STAT CHEM 8, ED
BUN: 41 mg/dL — ABNORMAL HIGH (ref 8–23)
Calcium, Ion: 1.15 mmol/L (ref 1.15–1.40)
Chloride: 102 mmol/L (ref 98–111)
Creatinine, Ser: 3.2 mg/dL — ABNORMAL HIGH (ref 0.61–1.24)
Glucose, Bld: 358 mg/dL — ABNORMAL HIGH (ref 70–99)
HCT: 34 % — ABNORMAL LOW (ref 39.0–52.0)
Hemoglobin: 11.6 g/dL — ABNORMAL LOW (ref 13.0–17.0)
Potassium: 4.1 mmol/L (ref 3.5–5.1)
Sodium: 132 mmol/L — ABNORMAL LOW (ref 135–145)
TCO2: 18 mmol/L — ABNORMAL LOW (ref 22–32)

## 2021-03-16 LAB — PROTIME-INR
INR: 1.2 (ref 0.8–1.2)
Prothrombin Time: 15 seconds (ref 11.4–15.2)

## 2021-03-16 LAB — CBC
HCT: 33.3 % — ABNORMAL LOW (ref 39.0–52.0)
Hemoglobin: 11.9 g/dL — ABNORMAL LOW (ref 13.0–17.0)
MCH: 34.5 pg — ABNORMAL HIGH (ref 26.0–34.0)
MCHC: 35.7 g/dL (ref 30.0–36.0)
MCV: 96.5 fL (ref 80.0–100.0)
Platelets: 126 10*3/uL — ABNORMAL LOW (ref 150–400)
RBC: 3.45 MIL/uL — ABNORMAL LOW (ref 4.22–5.81)
RDW: 12.6 % (ref 11.5–15.5)
WBC: 9.3 10*3/uL (ref 4.0–10.5)
nRBC: 0 % (ref 0.0–0.2)

## 2021-03-16 LAB — RESP PANEL BY RT-PCR (FLU A&B, COVID) ARPGX2
Influenza A by PCR: NEGATIVE
Influenza B by PCR: NEGATIVE
SARS Coronavirus 2 by RT PCR: NEGATIVE

## 2021-03-16 LAB — APTT: aPTT: 28 seconds (ref 24–36)

## 2021-03-16 LAB — ETHANOL: Alcohol, Ethyl (B): <10 mg/dL (ref <10)

## 2021-03-16 IMAGING — CT CT HEAD CODE STROKE
3 series · 15 of 47 positions shown, 18 images · non-contrast
Comparison: None.

CLINICAL DATA: Code stroke. Acute neuro deficit. Confusion, facial
droop.

EXAM:
CT HEAD WITHOUT CONTRAST
TECHNIQUE: Contiguous axial images were obtained from the base of the skull
through the vertex without intravenous contrast.

[Series 2: head 5.0 (person_name) (person_name) · axial · 0.45mm/px · z∈[-164,-24]mm · 9 of 34 slices shown, 12 images]
[im 3/34  brain]
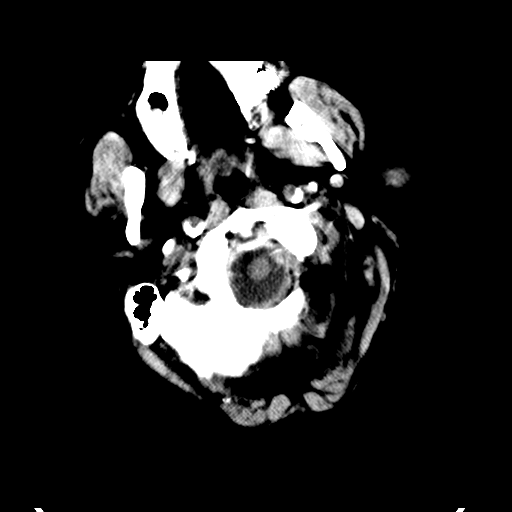
[im 3/34  bone]
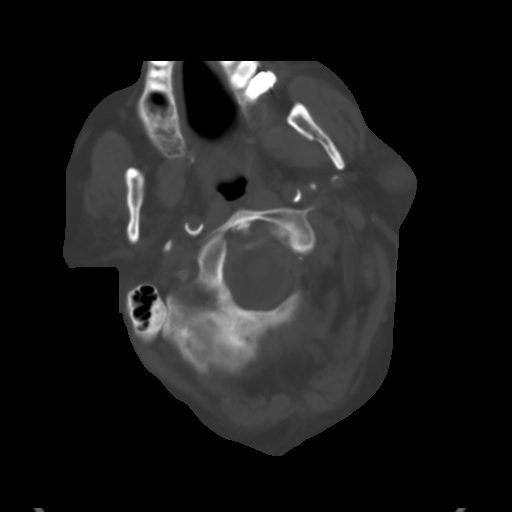
[im 6/34  brain]
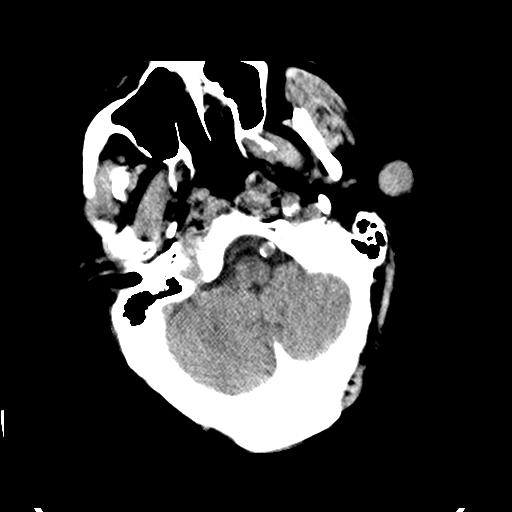
[im 10/34  brain]
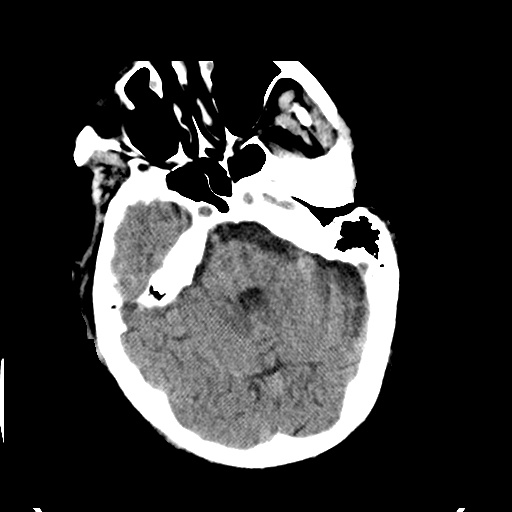
[im 13/34  brain]
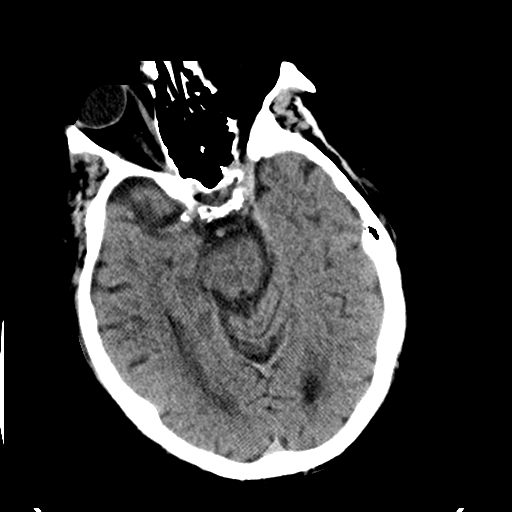
[im 18/34  brain]
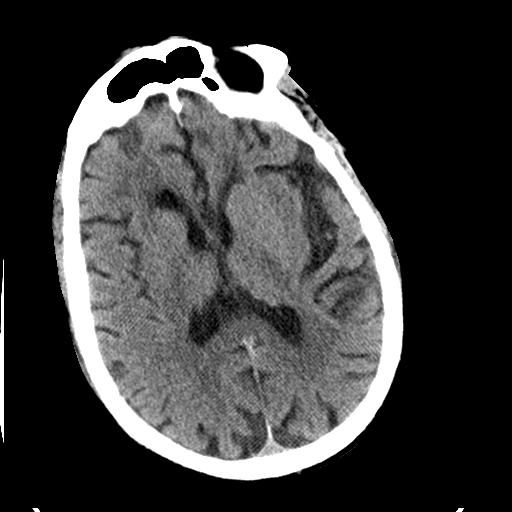
[im 18/34  bone]
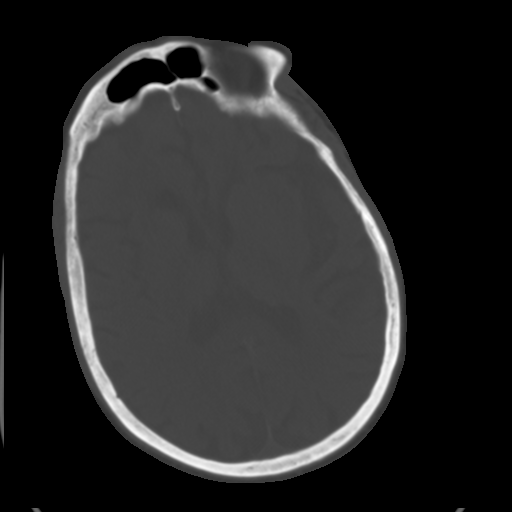
[im 21/34  brain]
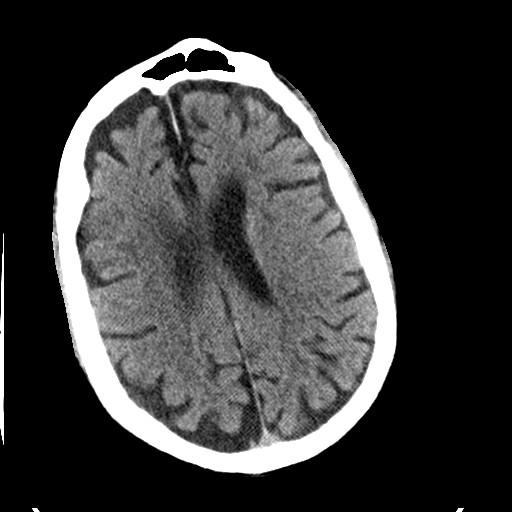
[im 24/34  brain]
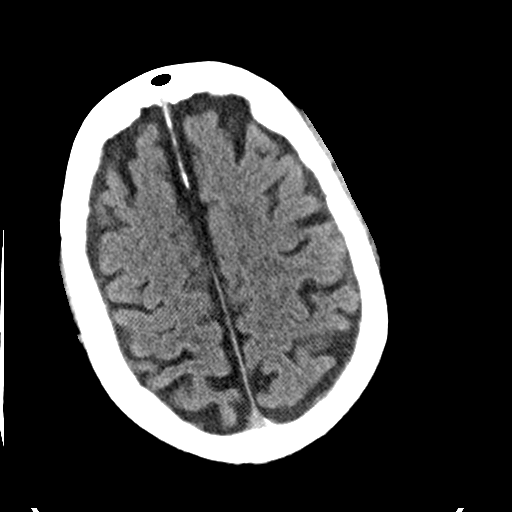
[im 28/34  brain]
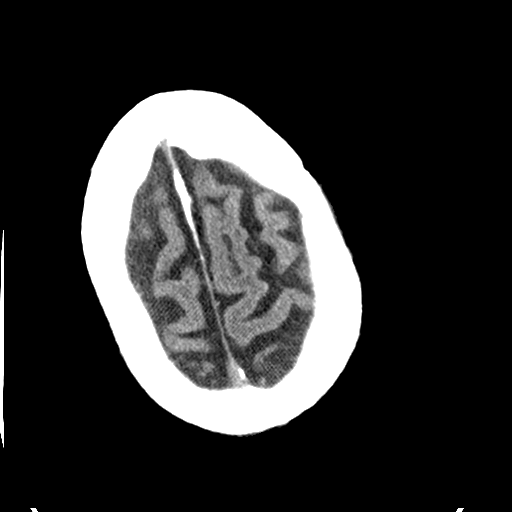
[im 31/34  brain]
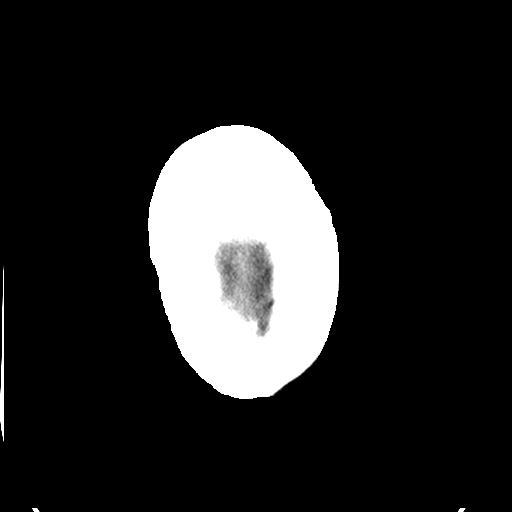
[im 31/34  bone]
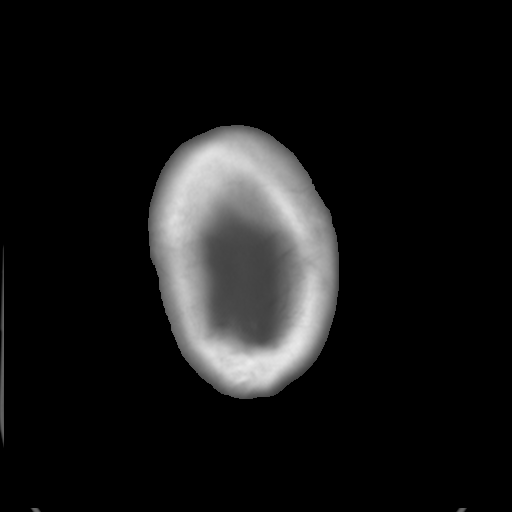

[Series 5: head 3.0 cor st · coronal · 0.33mm/px · 3 of 76 slices shown]
[im 26/76  brain]
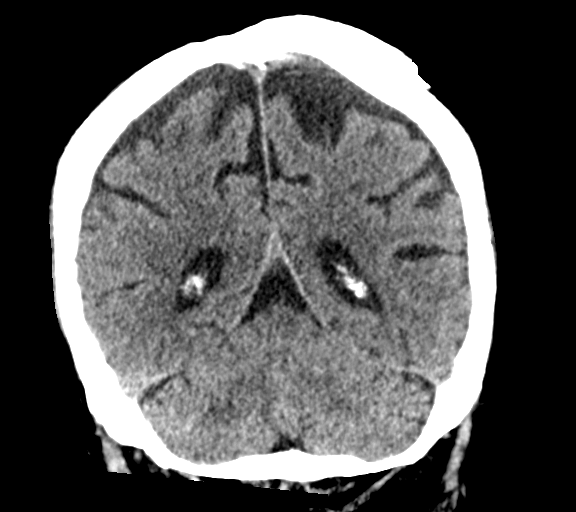
[im 34/76  brain]
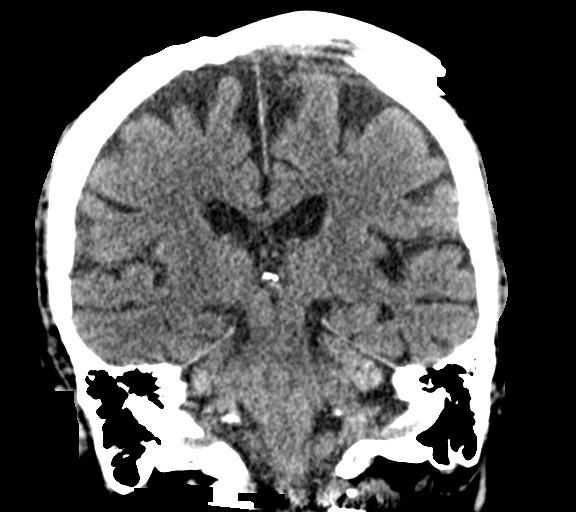
[im 42/76  brain]
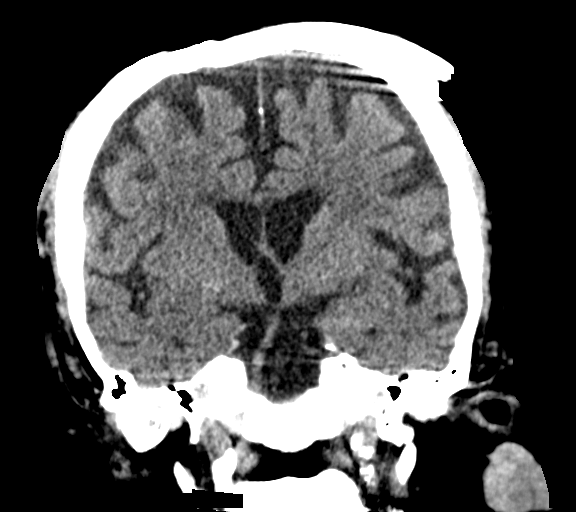

[Series 6: head 3.0 sag st · sagittal · 0.33mm/px · 3 of 60 slices shown]
[im 23/60  brain]
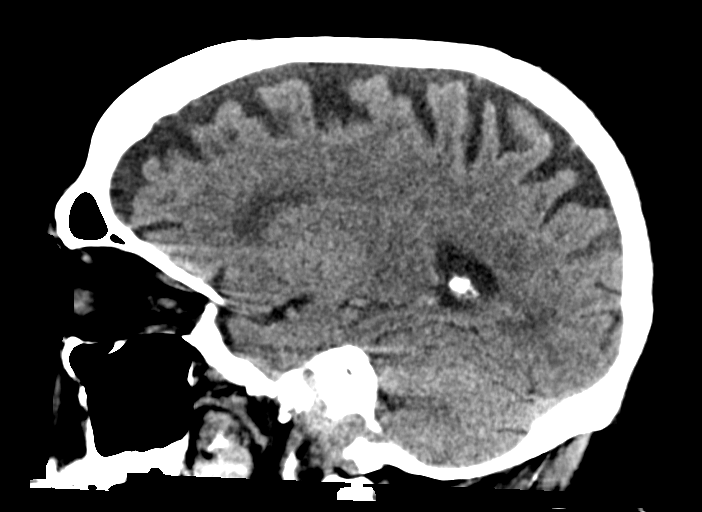
[im 30/60  brain]
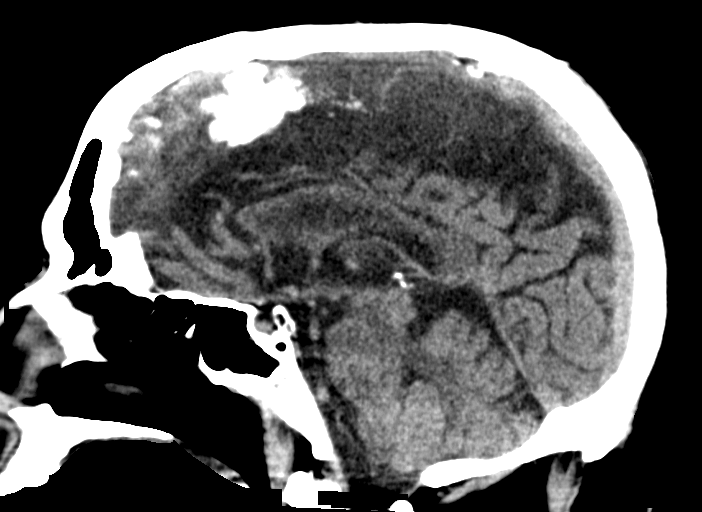
[im 38/60  brain]
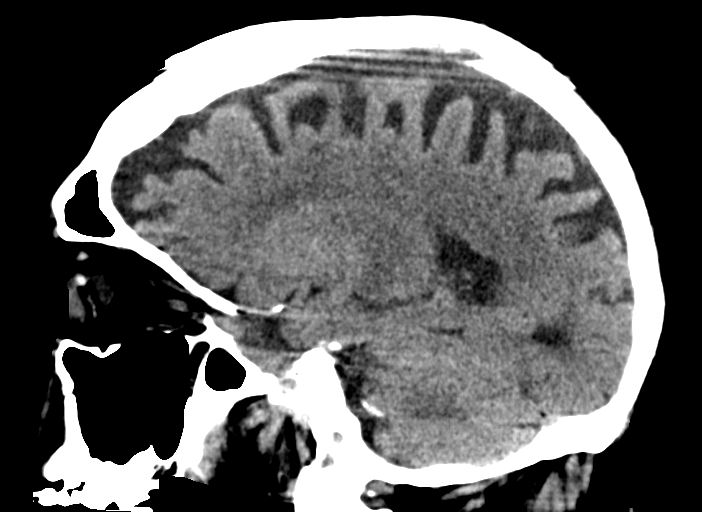

[15 of 47 positions shown; findings below may reference images not displayed]

FINDINGS: Brain: Generalized atrophy. Patchy white matter hypodensity
bilaterally likely chronic. Negative for hydrocephalus.

Negative for acute infarct, hemorrhage, mass.

Vascular: Negative for hyperdense vessel.

Skull: Negative

Sinuses/Orbits: Negative

Other: Motion degraded study

ASPECTS (Alberta Stroke Program Early CT Score)

- Ganglionic level infarction (caudate, lentiform nuclei, internal
capsule, insula, M1-M3 cortex): 7

- Supraganglionic infarction (M4-M6 cortex): 3

Total score (0-10 with 10 being normal): 10
IMPRESSION: 1. No acute abnormality.
2. Motion degraded study
3. ASPECTS is 10
4. Code stroke imaging results were communicated on [DATE] at
[DATE] to provider QUIRIJN via text page

## 2021-03-16 IMAGING — MR MR HEAD W/O CM
6 of 11 series · 24 of 48 positions shown · non-contrast
Comparison: CT from earlier the same day.

CLINICAL DATA: Initial evaluation for visual disturbance,
dizziness, mental status change.

EXAM:
MRI HEAD WITHOUT CONTRAST
MRA HEAD WITHOUT CONTRAST
TECHNIQUE: Multiplanar, multi-echo pulse sequences of the brain and surrounding
structures were acquired without intravenous contrast. Angiographic
images of the Circle of Willis were acquired using MRA technique
without intravenous contrast.

[Series 3: DWI · axial · 3.0mm · 0.94mm/px · z∈[-84,+72]mm · 6 of 102 slices shown (1 of 2)]
[im 1/102]
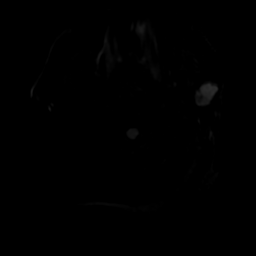
[im 21/102]
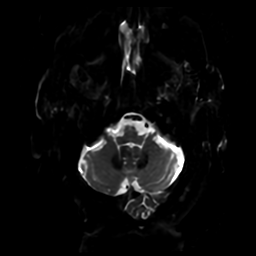
[im 41/102]
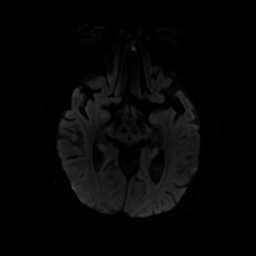
[im 61/102]
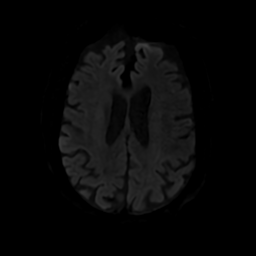
[im 81/102]
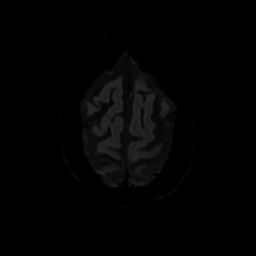
[im 102/102]
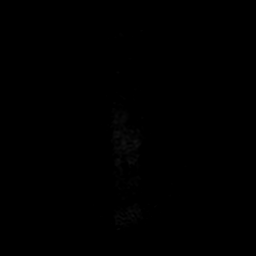

[Series 4: DWI · coronal · 4.0mm · 0.94mm/px · 6 of 81 slices shown (2 of 2)]
[im 1/81]
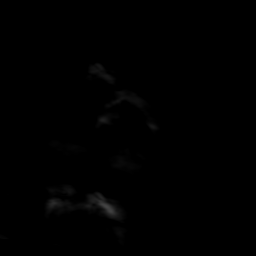
[im 17/81]
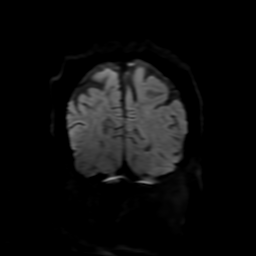
[im 33/81]
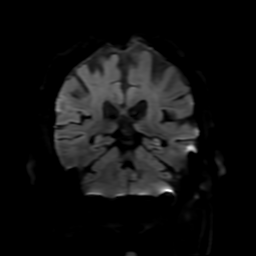
[im 49/81]
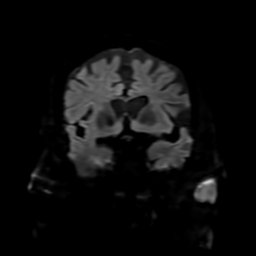
[im 65/81]
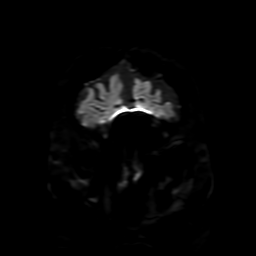
[im 81/81]
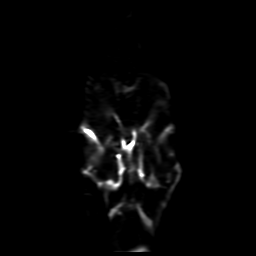

[Series 5: FLAIR · sagittal · 5.0mm · 0.23mm/px · 2 of 29 slices shown (1 of 2)]
[im 1/29]
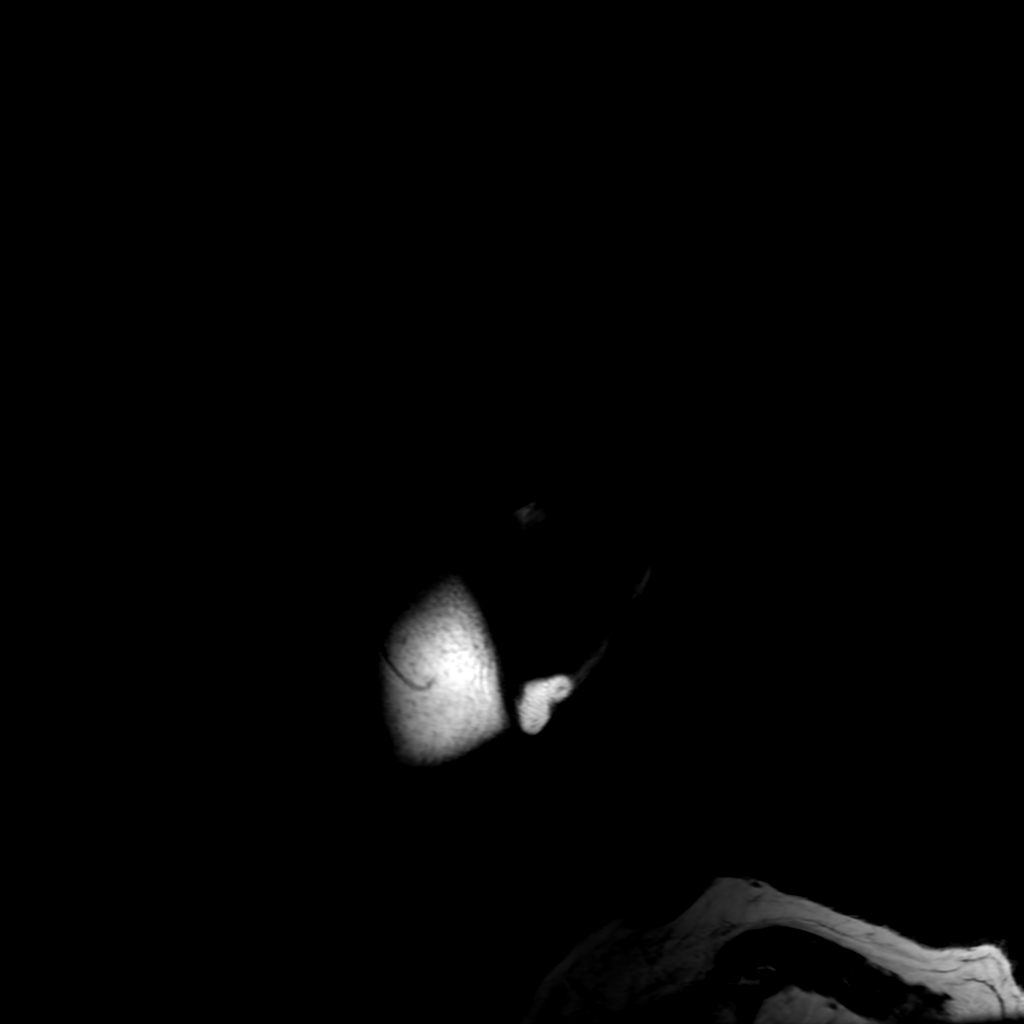
[im 29/29]
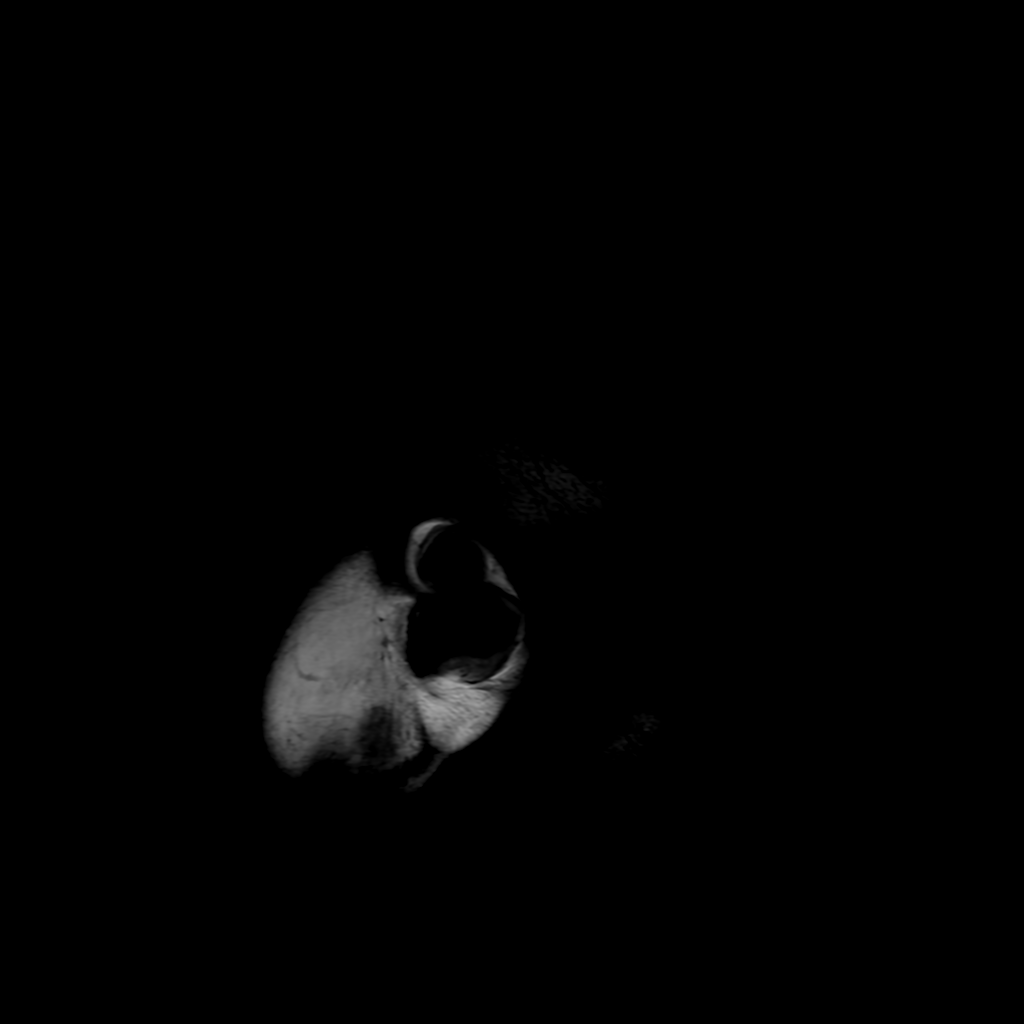

[Series 7: FLAIR · axial · 4.0mm · 0.45mm/px · z∈[-89,+71]mm · 3 of 38 slices shown (2 of 2)]
[im 1/38]
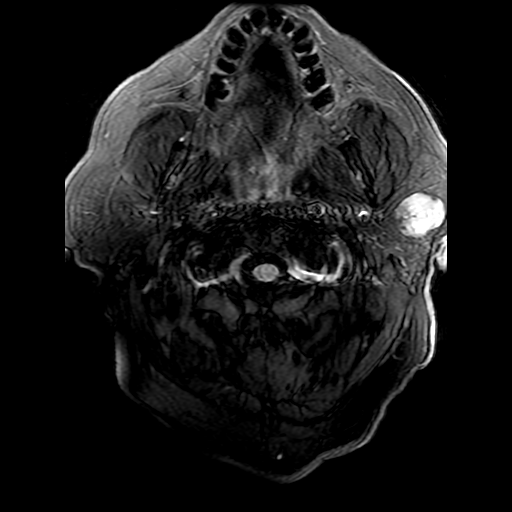
[im 19/38]
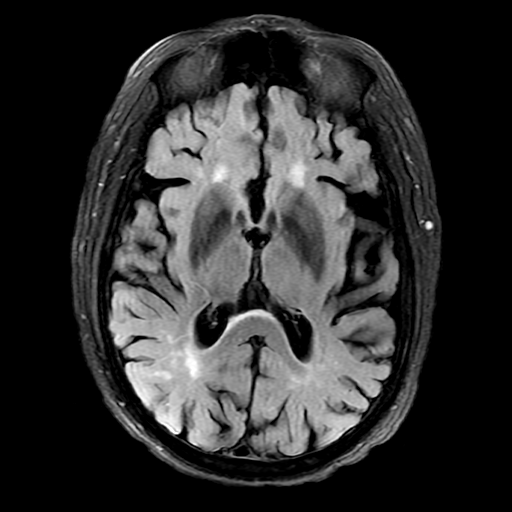
[im 38/38]
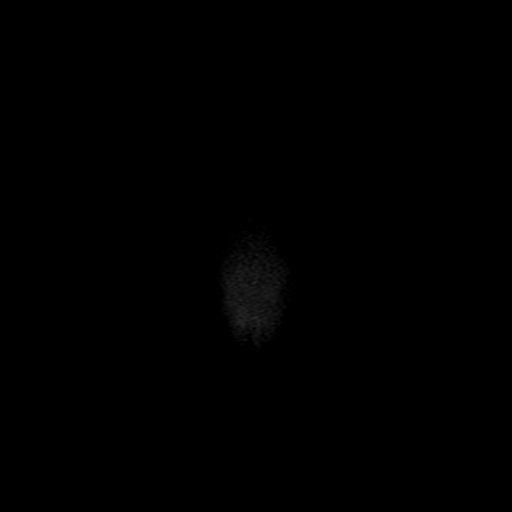

[Series 350: ADC · axial · 3.0mm · 0.94mm/px · z∈[-84,+72]mm · 4 of 53 slices shown (1 of 2)]
[im 1/53]
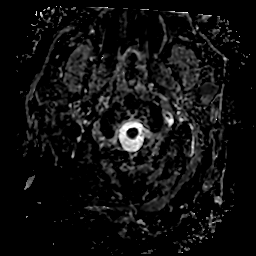
[im 18/53]
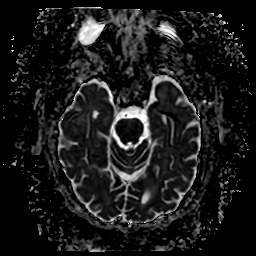
[im 35/53]
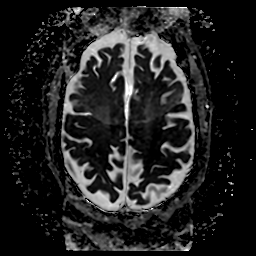
[im 53/53]
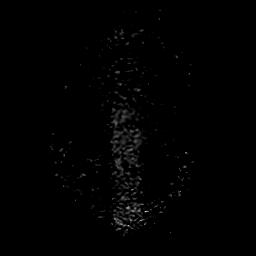

[Series 450: ADC · coronal · 4.0mm · 0.94mm/px · 3 of 41 slices shown (2 of 2)]
[im 1/41]
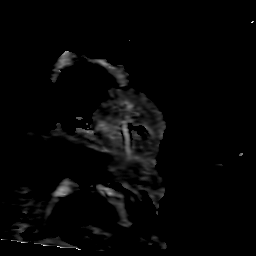
[im 21/41]
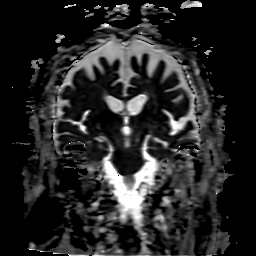
[im 41/41]
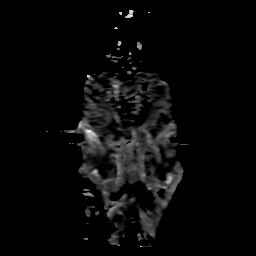

[24 of 48 positions shown; findings below may reference images not displayed]

FINDINGS: MRI HEAD FINDINGS

Brain: Diffuse prominence of the CSF containing spaces compatible
generalized age-related cerebral atrophy. Scattered patchy T2/FLAIR
hyperintensity within the periventricular, deep, and subcortical
white matter both cerebral hemispheres noted, most characteristic of
chronic microvascular ischemic disease, moderate in nature.

No evidence for acute or subacute infarct. Gray-white matter
differentiation maintained. No areas of remote cortical infarction.
No acute intracranial hemorrhage. Chronic hemosiderin staining seen
extending along a cortical sulcus at the high posterior right
parietal lobe (series 8, image 85). No acute hemorrhage seen within
this region on prior CT. Finding suggest prior subarachnoid
hemorrhage at this location.

No mass lesion, midline shift or mass effect. No hydrocephalus or
extra-axial fluid collection. Pituitary gland within normal limits.
Suprasellar region unremarkable. Midline structures intact and
normal.

Vascular: Major intracranial vascular flow voids are well
maintained.

Skull and upper cervical spine: Craniocervical junction within
normal limits. Bone marrow signal intensity normal. No scalp soft
tissue abnormality.

Sinuses/Orbits: Prior bilateral ocular lens replacement with
sequelae of prior scleral banding on the left. Globes and orbital
soft tissues demonstrate no other acute finding. Mild mucosal
thickening within the ethmoidal air cells. Paranasal sinuses are
otherwise clear. Trace bilateral mastoid effusions, of doubtful
significance. Visualized nasopharynx unremarkable.

Other: 2.7 cm well-circumscribed heterogeneous mass seen within the
left parotid gland (series 10, image 20), indeterminate.

MRA HEAD FINDINGS

Anterior circulation: Visualized distal cervical segments of the
internal carotid arteries are patent with antegrade flow. Petrous,
cavernous, and supraclinoid segments widely patent without stenosis
or other abnormality. Origin of the ophthalmic arteries patent. A1
segments widely patent. Normal anterior communicating artery
complex. Anterior cerebral arteries patent to their distal aspects
without stenosis. No M1 stenosis or occlusion. Left M1 segments
somewhat tortuous and superiorly directed. Normal MCA bifurcations.
Distal MCA branches well perfused and symmetric.

Posterior circulation: Dominant left vertebral artery widely patent
to the vertebrobasilar junction. Right vertebral artery hypoplastic
and largely terminates in PICA, although a tiny branch ascending
towards the vertebrobasilar junction. Both PICA patent. Basilar
patent to its distal aspect without stenosis. Superior cerebral
arteries patent bilaterally. Right PCA supplied via the basilar.
Fetal type origin left PCA. Both PCAs perfused to their distal
aspects without visible stenosis.

Anatomic variants: Fetal type origin left PCA. No aneurysm or other
vascular abnormality.
IMPRESSION: MRI HEAD IMPRESSION:

1. No acute intracranial abnormality.
2. Chronic hemosiderin staining along a cortical sulcus at the high
posterior right parietal lobe, suggesting prior subarachnoid
hemorrhage at this location.
3. Underlying age-related cerebral atrophy with moderate chronic
microvascular ischemic disease.
4. 2.7 cm left parotid mass, indeterminate. Nonemergent ENT referral
suggested for further workup and evaluation.

MRA HEAD IMPRESSION:

Negative intracranial MRA. No large vessel occlusion,
hemodynamically significant stenosis, or other acute vascular
abnormality.

## 2021-03-16 IMAGING — MR MR MRA HEAD W/O CM
3 series · 17 of 48 positions shown · non-contrast
Comparison: CT from earlier the same day.

CLINICAL DATA: Initial evaluation for visual disturbance,
dizziness, mental status change.

EXAM:
MRI HEAD WITHOUT CONTRAST
MRA HEAD WITHOUT CONTRAST
TECHNIQUE: Multiplanar, multi-echo pulse sequences of the brain and surrounding
structures were acquired without intravenous contrast. Angiographic
images of the Circle of Willis were acquired using MRA technique
without intravenous contrast.

[Series 3: ax (id) · axial · 1.0mm · 0.43mm/px · z∈[-73,+18]mm · 13 of 197 slices shown]
[im 1/197]
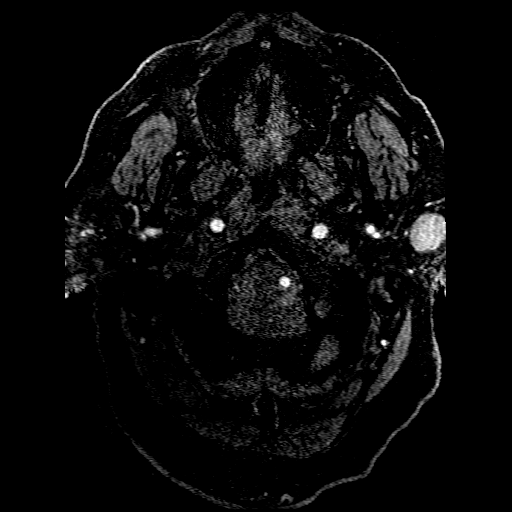
[im 5/197]
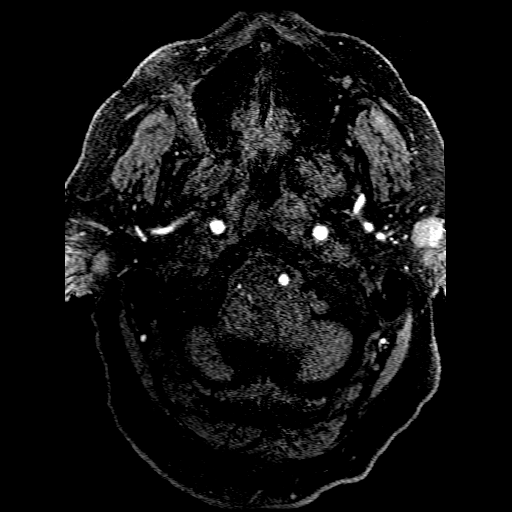
[im 10/197]
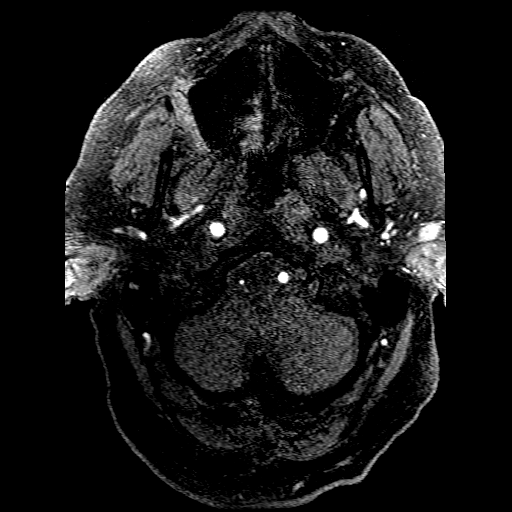
[im 32/197]
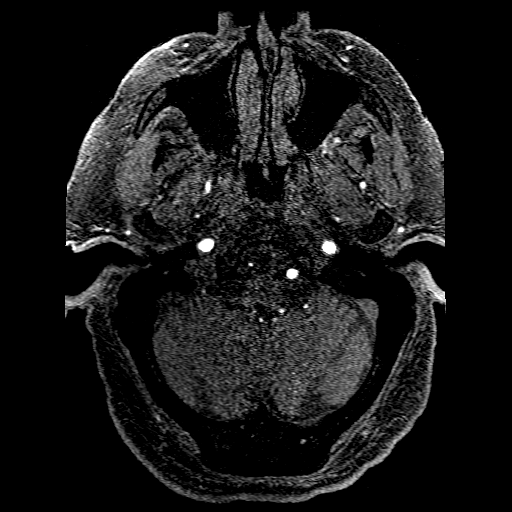
[im 37/197]
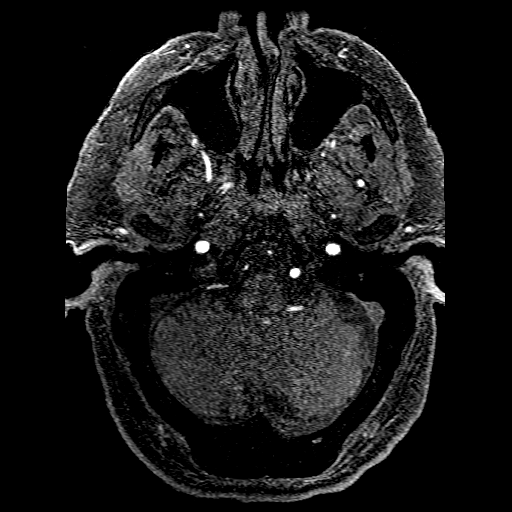
[im 60/197]
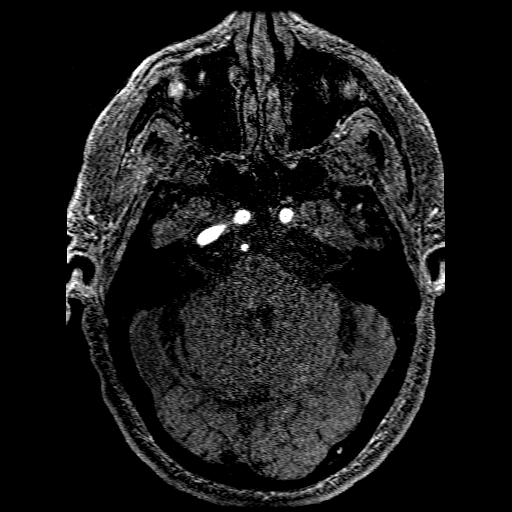
[im 87/197]
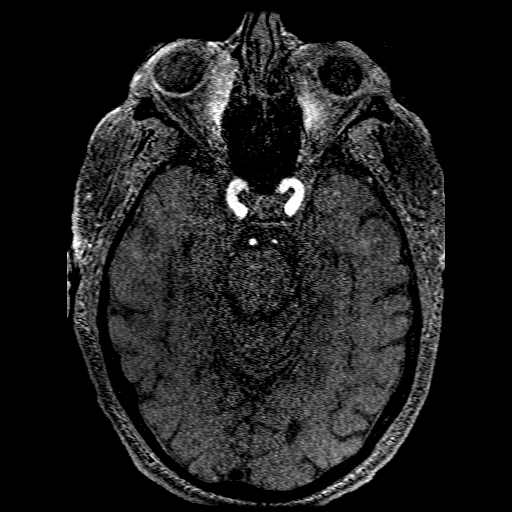
[im 101/197]
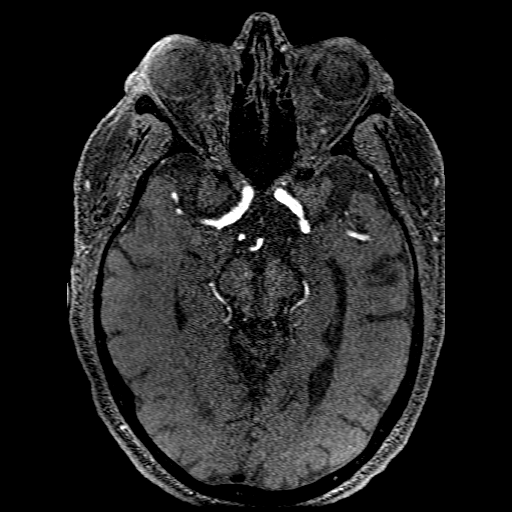
[im 110/197]
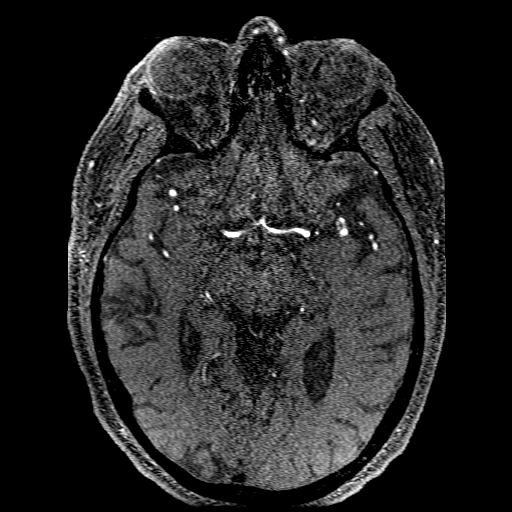
[im 137/197]
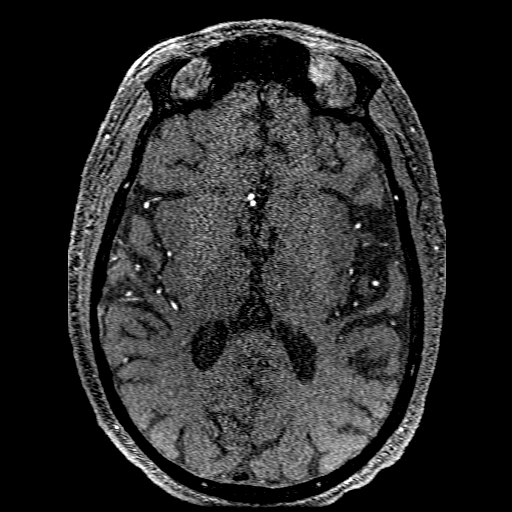
[im 160/197]
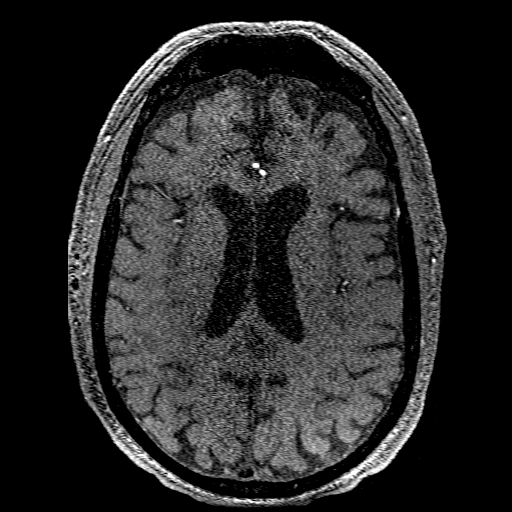
[im 165/197]
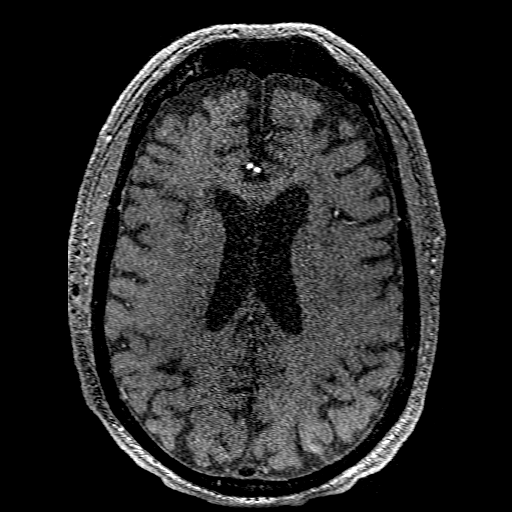
[im 187/197]
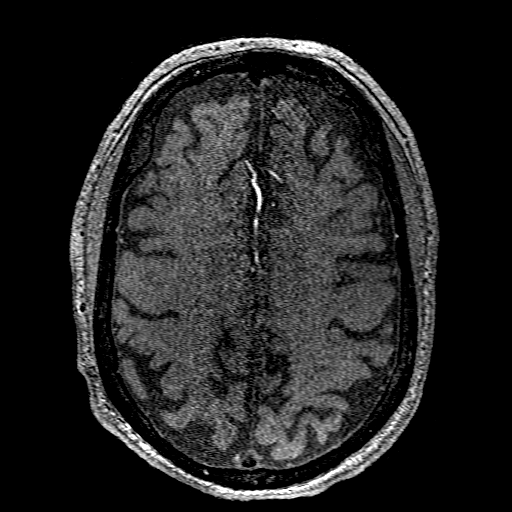

[Series 300: col:ax (id) · axial · 1.0mm · 0.43mm/px · 1 of 1 slices shown]
[im 1/1]
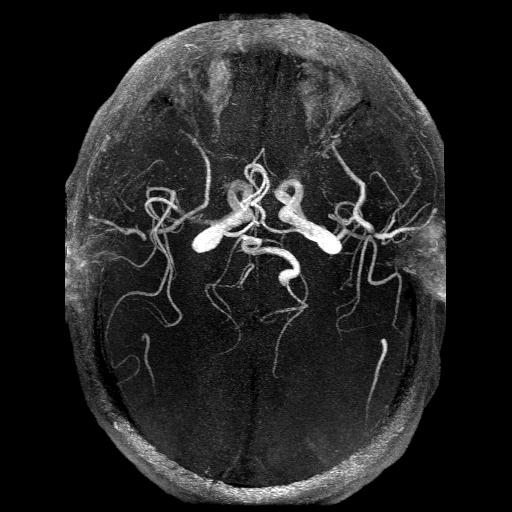

[Series 301: pjn:ax (id) · sagittal · 1.0mm · 0.43mm/px · 3 of 13 slices shown]
[im 1/13]
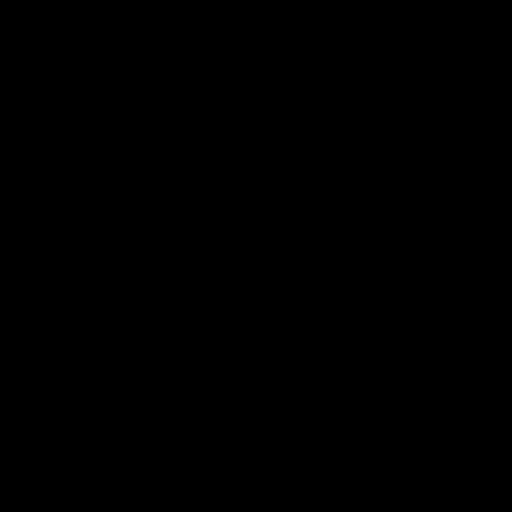
[im 7/13]
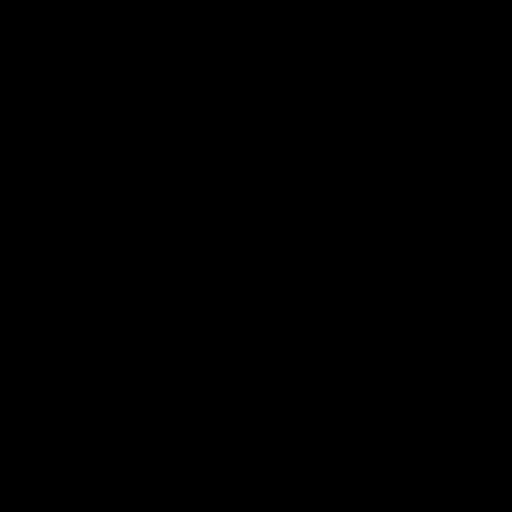
[im 13/13]
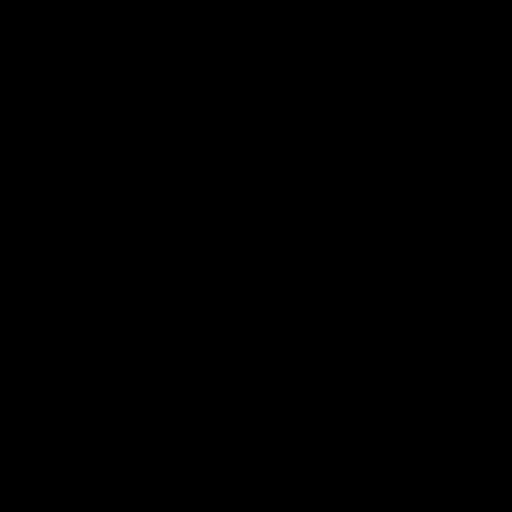

[17 of 48 positions shown; findings below may reference images not displayed]

FINDINGS: MRI HEAD FINDINGS

Brain: Diffuse prominence of the CSF containing spaces compatible
generalized age-related cerebral atrophy. Scattered patchy T2/FLAIR
hyperintensity within the periventricular, deep, and subcortical
white matter both cerebral hemispheres noted, most characteristic of
chronic microvascular ischemic disease, moderate in nature.

No evidence for acute or subacute infarct. Gray-white matter
differentiation maintained. No areas of remote cortical infarction.
No acute intracranial hemorrhage. Chronic hemosiderin staining seen
extending along a cortical sulcus at the high posterior right
parietal lobe (series 8, image 85). No acute hemorrhage seen within
this region on prior CT. Finding suggest prior subarachnoid
hemorrhage at this location.

No mass lesion, midline shift or mass effect. No hydrocephalus or
extra-axial fluid collection. Pituitary gland within normal limits.
Suprasellar region unremarkable. Midline structures intact and
normal.

Vascular: Major intracranial vascular flow voids are well
maintained.

Skull and upper cervical spine: Craniocervical junction within
normal limits. Bone marrow signal intensity normal. No scalp soft
tissue abnormality.

Sinuses/Orbits: Prior bilateral ocular lens replacement with
sequelae of prior scleral banding on the left. Globes and orbital
soft tissues demonstrate no other acute finding. Mild mucosal
thickening within the ethmoidal air cells. Paranasal sinuses are
otherwise clear. Trace bilateral mastoid effusions, of doubtful
significance. Visualized nasopharynx unremarkable.

Other: 2.7 cm well-circumscribed heterogeneous mass seen within the
left parotid gland (series 10, image 20), indeterminate.

MRA HEAD FINDINGS

Anterior circulation: Visualized distal cervical segments of the
internal carotid arteries are patent with antegrade flow. Petrous,
cavernous, and supraclinoid segments widely patent without stenosis
or other abnormality. Origin of the ophthalmic arteries patent. A1
segments widely patent. Normal anterior communicating artery
complex. Anterior cerebral arteries patent to their distal aspects
without stenosis. No M1 stenosis or occlusion. Left M1 segments
somewhat tortuous and superiorly directed. Normal MCA bifurcations.
Distal MCA branches well perfused and symmetric.

Posterior circulation: Dominant left vertebral artery widely patent
to the vertebrobasilar junction. Right vertebral artery hypoplastic
and largely terminates in PICA, although a tiny branch ascending
towards the vertebrobasilar junction. Both PICA patent. Basilar
patent to its distal aspect without stenosis. Superior cerebral
arteries patent bilaterally. Right PCA supplied via the basilar.
Fetal type origin left PCA. Both PCAs perfused to their distal
aspects without visible stenosis.

Anatomic variants: Fetal type origin left PCA. No aneurysm or other
vascular abnormality.
IMPRESSION: MRI HEAD IMPRESSION:

1. No acute intracranial abnormality.
2. Chronic hemosiderin staining along a cortical sulcus at the high
posterior right parietal lobe, suggesting prior subarachnoid
hemorrhage at this location.
3. Underlying age-related cerebral atrophy with moderate chronic
microvascular ischemic disease.
4. 2.7 cm left parotid mass, indeterminate. Nonemergent ENT referral
suggested for further workup and evaluation.

MRA HEAD IMPRESSION:

Negative intracranial MRA. No large vessel occlusion,
hemodynamically significant stenosis, or other acute vascular
abnormality.

## 2021-03-16 MED ORDER — ONDANSETRON HCL 4 MG/2ML IJ SOLN
4.0000 mg | Freq: Four times a day (QID) | INTRAMUSCULAR | Status: DC | PRN
  Administered 2021-03-20: 4 mg via INTRAVENOUS
  Filled 2021-03-16: qty 2

## 2021-03-16 MED ORDER — SODIUM CHLORIDE 0.9 % IV BOLUS
1000.0000 mL | Freq: Once | INTRAVENOUS | Status: AC
  Administered 2021-03-16: 1000 mL via INTRAVENOUS

## 2021-03-16 MED ORDER — DONEPEZIL HCL 10 MG PO TABS
10.0000 mg | ORAL_TABLET | Freq: Every day | ORAL | Status: DC
  Administered 2021-03-17 – 2021-03-23 (×8): 10 mg via ORAL
  Filled 2021-03-16 (×9): qty 1

## 2021-03-16 MED ORDER — MEMANTINE HCL ER 14 MG PO CP24
14.0000 mg | ORAL_CAPSULE | Freq: Every day | ORAL | Status: DC
  Filled 2021-03-16: qty 1

## 2021-03-16 MED ORDER — ACETAMINOPHEN 325 MG PO TABS
650.0000 mg | ORAL_TABLET | Freq: Four times a day (QID) | ORAL | Status: DC | PRN
  Administered 2021-03-17 – 2021-03-18 (×3): 650 mg via ORAL
  Filled 2021-03-16 (×4): qty 2

## 2021-03-16 MED ORDER — ASPIRIN EC 81 MG PO TBEC
81.0000 mg | DELAYED_RELEASE_TABLET | Freq: Every day | ORAL | Status: DC
  Administered 2021-03-18 – 2021-03-24 (×7): 81 mg via ORAL
  Filled 2021-03-16 (×7): qty 1

## 2021-03-16 MED ORDER — LORAZEPAM 2 MG/ML IJ SOLN
1.0000 mg | Freq: Once | INTRAMUSCULAR | Status: AC
  Administered 2021-03-16: 1 mg via INTRAVENOUS
  Filled 2021-03-16: qty 1

## 2021-03-16 MED ORDER — FINASTERIDE 5 MG PO TABS
5.0000 mg | ORAL_TABLET | Freq: Every day | ORAL | Status: DC
  Administered 2021-03-18 – 2021-03-24 (×7): 5 mg via ORAL
  Filled 2021-03-16 (×7): qty 1

## 2021-03-16 MED ORDER — CLOPIDOGREL BISULFATE 75 MG PO TABS
75.0000 mg | ORAL_TABLET | Freq: Every day | ORAL | Status: DC
  Administered 2021-03-18 – 2021-03-24 (×7): 75 mg via ORAL
  Filled 2021-03-16 (×7): qty 1

## 2021-03-16 MED ORDER — ATORVASTATIN CALCIUM 40 MG PO TABS
40.0000 mg | ORAL_TABLET | Freq: Every evening | ORAL | Status: DC
  Administered 2021-03-17 – 2021-03-23 (×7): 40 mg via ORAL
  Filled 2021-03-16 (×7): qty 1

## 2021-03-16 MED ORDER — LACTATED RINGERS IV SOLN: INTRAVENOUS | Status: DC

## 2021-03-16 MED ORDER — LISINOPRIL 10 MG PO TABS: 5.0000 mg | ORAL_TABLET | Freq: Every day | ORAL | Status: DC

## 2021-03-16 MED ORDER — HEPARIN SODIUM (PORCINE) 5000 UNIT/ML IJ SOLN
5000.0000 [IU] | Freq: Three times a day (TID) | INTRAMUSCULAR | Status: DC
  Administered 2021-03-17 – 2021-03-24 (×21): 5000 [IU] via SUBCUTANEOUS
  Filled 2021-03-16 (×21): qty 1

## 2021-03-16 MED ORDER — INSULIN ASPART 100 UNIT/ML IJ SOLN
0.0000 [IU] | Freq: Four times a day (QID) | INTRAMUSCULAR | Status: DC | PRN
  Administered 2021-03-17: 9 [IU] via SUBCUTANEOUS
  Administered 2021-03-18: 3 [IU] via SUBCUTANEOUS
  Administered 2021-03-18: 5 [IU] via SUBCUTANEOUS
  Administered 2021-03-21: 3 [IU] via SUBCUTANEOUS
  Administered 2021-03-21: 1 [IU] via SUBCUTANEOUS

## 2021-03-16 MED ORDER — METFORMIN HCL 500 MG PO TABS
500.0000 mg | ORAL_TABLET | Freq: Two times a day (BID) | ORAL | Status: DC
  Filled 2021-03-16: qty 1

## 2021-03-16 MED ORDER — ONDANSETRON HCL 4 MG PO TABS: 4.0000 mg | ORAL_TABLET | Freq: Four times a day (QID) | ORAL | Status: DC | PRN

## 2021-03-16 MED ORDER — ACETAMINOPHEN 650 MG RE SUPP: 650.0000 mg | Freq: Four times a day (QID) | RECTAL | Status: DC | PRN

## 2021-03-16 NOTE — Code Documentation (Signed)
Stroke Response Nurse Documentation Code Documentation  Leslie [name] is a 80 y.o. male arriving to [facility] ED via [facility] on 03/16/21 with past medical hx of diabetes, hypertension, coronary artery disease, NSTEMI, neuropathy, sleep apnea, GERD, and prostate cancer. Baseline ambulation with walker. Code stroke was activated by EMS. Patient from home where he was LKW at 1000 and now complaining of confusion, left weakness and droop. Symptoms resolving by the time patient arrived to ED.   Stroke team at the bedside on patient arrival. Labs drawn and patient cleared for CT by EDP. Patient to CT with team. NIHSS 3, see documentation for details and code stroke times. Patient disoriented and right hemianopia on exam.   The following imaging was completed: CT. No CTA as creatinine 3.20. Patient is not a candidate for tPA due to outside of the window. Care/Plan Q2 assessments. Bedside handoff with ED RN.    Leslie [name] Stroke Response RN

## 2021-03-16 NOTE — H&P (Signed)
History and Physical    [name] [id number] DOB: Aug 03, 1941 DOA: 03/16/2021  PCP: Wallene Dales, MD   Patient coming from:  Home  Chief Complaint: AMS  HPI: Leslie [name] is a 80 y.o. male with medical history significant for HTN, DMT2, Dementia, CAD who presents from home by EMS for altered mental status.  According to his wife who is at the bedside patient has been very lethargic and somnolent all day.  He has not been talking as much as he normally does.  She reports that for the last week he has not been eating well and he has not had much to drink.  He was a code stroke when he presented but was not negative on MRI to have a stroke.  He was seen by neurology who did not feel the need to be any further work-up.  He was found to have acute renal failure on labs which is suspected be the cause of his altered mental status.  Wife reports he has not had any fever or chills.  She reports that yesterday he did sit outside on her back deck in the sun for a few hours and was not drinking much.  Not had any nausea, vomiting or diarrhea.  He states he has not had any complaints of chest pain or pressure in the last few days.  ED Course: In the emergency room patient is been somnolent and is unable to provide any history.  Wife is at the bedside and provides history.  CT of the head was negative for acute bleed.  MRI of the brain was negative for acute stroke and MRA of the brain showed no large vessel occlusion.  Lab work revealed an acute renal failure with a creatinine of 3.20 from a baseline of 0.87.  CBC is unremarkable.  Electrolytes were normal.  Hospitalist service asked to admit for further management.  ER is ordered and renal ultrasound which is pending  Review of Systems:  Cannot obtain ROS due to dementia  Past Medical History:  Diagnosis Date   Arthritis    "MY HAND "   Cancer ([place])    HX OF PROSTATE CANCER   Complication of anesthesia    DIFFICULT WAKING    Coronary artery  disease    Diabetes mellitus without complication (HCC)    GERD (gastroesophageal reflux disease)    H/O detached retina repair    Hypertension    Neuropathy    DIABETIC FEET   NSTEMI (non-ST elevated myocardial infarction) ([place])    Sleep apnea     Past Surgical History:  Procedure Laterality Date   CATARACT EXTRACTION     CHOLECYSTECTOMY     CORONARY STENT PLACEMENT     LEG SURGERY Right    TOTAL KNEE ARTHROPLASTY Right     Social History  reports that he quit smoking about 27 years ago. His smoking use included cigarettes. He has never used smokeless tobacco. He reports current alcohol use. He reports that he does not use drugs.  No Known Allergies  History reviewed. No pertinent family history.   Prior to Admission medications   Medication Sig Start Date End Date Taking? Authorizing Provider  alendronate (FOSAMAX) 70 MG tablet Take 70 mg by mouth every Monday. 03/04/21  Yes [provider]  aspirin EC 81 MG tablet Take 81 mg by mouth daily.   Yes [provider]  atorvastatin (LIPITOR) 40 MG tablet Take 40 mg by mouth every evening. 12/02/20  Yes [provider]  clopidogrel (PLAVIX) 75 MG tablet Take 75 mg by mouth daily.   Yes [provider]  Cyanocobalamin 2500 MCG SUBL Place 2,500 mg under the tongue daily. 05/04/19  Yes [provider]  donepezil (ARICEPT) 10 MG tablet Take 10 mg by mouth at bedtime.   Yes [provider]  doxycycline (MONODOX) 100 MG capsule Take 100 mg by mouth in the morning and at bedtime. 03/02/21  Yes [provider]  finasteride (PROSCAR) 5 MG tablet Take 5 mg by mouth daily. 07/27/20 07/27/21 Yes [provider]  gabapentin (NEURONTIN) 300 MG capsule Take 300 mg by mouth at bedtime. 12/12/16  Yes [provider]  insulin aspart protamine - aspart (NOVOLOG 70/30 MIX) (70-30) 100 UNIT/ML FlexPen Inject 20-25 Units into the skin in the morning and at bedtime. 25 units in the  morning and 20 units in the evening 03/03/21  Yes [provider]  lisinopril (ZESTRIL) 5 MG tablet Take 5 mg by mouth daily. 10/13/20  Yes [provider]  memantine (NAMENDA XR) 14 MG CP24 24 hr capsule Take 14 mg by mouth daily. 05/20/20  Yes [provider]  metFORMIN (GLUCOPHAGE) 500 MG tablet Take 500 mg by mouth 2 (two) times daily with a meal. 11/29/20 11/29/21 Yes [provider]  nitroGLYCERIN (NITROSTAT) 0.4 MG SL tablet Place 0.4 mg under the tongue every 5 (five) minutes as needed for chest pain. 09/22/15  Yes [provider]  silver sulfADIAZINE (SILVADENE) 1 % cream Apply 1 application topically 2 (two) times daily. 03/02/21  Yes [provider]    Physical Exam: Vitals:   03/16/21 1904 03/16/21 1916 03/16/21 1930 03/16/21 2107  BP: 131/65  128/61 129/69  Pulse: 93  94 82  Resp: 20  (!) 21 18  Temp: 98.9 F (37.2 C)     TempSrc: Oral     SpO2: 94%  94% 99%  Height:  '5\' 9"'$  (1.753 m)      Constitutional: NAD, calm, comfortable Vitals:   03/16/21 1904 03/16/21 1916 03/16/21 1930 03/16/21 2107  BP: 131/65  128/61 129/69  Pulse: 93  94 82  Resp: 20  (!) 21 18  Temp: 98.9 F (37.2 C)     TempSrc: Oral     SpO2: 94%  94% 99%  Height:  '5\' 9"'$  (1.753 m)     General: WDWN, Somulent Eyes: EOMI, PERRL, conjunctivae normal.  Sclera nonicteric HENT:  [place]/AT, external ears normal. Nares patent without epistasis. Mucous membranes are moist.  Neck: Soft, normal range of motion, supple, no masses, no thyromegaly.  Trachea midline Respiratory: clear to auscultation bilaterally, no wheezing, no crackles. Normal respiratory effort. No accessory muscle use.  Cardiovascular: Regular rate and rhythm, no murmurs / rubs / gallops. No extremity edema.  Abdomen: Soft, no tenderness, nondistended, no rebound or guarding. Bowel sounds normoactive Musculoskeletal No cyanosis. No joint deformity upper and lower extremities  Skin: Warm, dry, intact  no rashes, lesions, ulcers. No induration Neurologic:  unable to obtain due to condition.  Psychiatric: Normal judgment and insight.  Normal mood.    Labs on Admission: I have personally reviewed following labs and imaging studies  CBC: Recent Labs  Lab 03/16/21 1839  WBC 9.3  NEUTROABS 8.2*  HGB 11.9*  11.6*  HCT 33.3*  34.0*  MCV 96.5  PLT 126*    Basic Metabolic Panel: Recent Labs  Lab 03/16/21 1839  NA 131*  132*  K 4.0  4.1  CL 102  102  CO2 18*  GLUCOSE 359*  358*  BUN 47*  41*  CREATININE 3.20*  3.20*  CALCIUM 8.8*    GFR: CrCl cannot be calculated (Unknown ideal weight.).  Liver Function Tests: Recent Labs  Lab 03/16/21 1839  AST 17  ALT 18  ALKPHOS 54  BILITOT 1.1  PROT 6.1*  ALBUMIN 3.2*    Urine analysis: No results found for: COLORURINE, APPEARANCEUR, LABSPEC, PHURINE, GLUCOSEU, HGBUR, BILIRUBINUR, KETONESUR, PROTEINUR, UROBILINOGEN, NITRITE, LEUKOCYTESUR  Radiological Exams on Admission: MR ANGIO HEAD WO CONTRAST  Result Date: 03/16/2021 CLINICAL DATA:  Initial evaluation for visual disturbance, dizziness, mental status change. EXAM: MRI HEAD WITHOUT CONTRAST MRA HEAD WITHOUT CONTRAST TECHNIQUE: Multiplanar, multi-echo pulse sequences of the brain and surrounding structures were acquired without intravenous contrast. Angiographic images of the Circle of Willis were acquired using MRA technique without intravenous contrast. COMPARISON:  CT from earlier the same day. FINDINGS: MRI HEAD FINDINGS Brain: Diffuse prominence of the CSF containing spaces compatible generalized age-related cerebral atrophy. Scattered patchy T2/FLAIR hyperintensity within the periventricular, deep, and subcortical white matter both cerebral hemispheres noted, most characteristic of chronic microvascular ischemic disease, moderate in nature. No evidence for acute or subacute infarct. Gray-white matter differentiation maintained. No areas of remote cortical infarction.  No acute intracranial hemorrhage. Chronic hemosiderin staining seen extending along a cortical sulcus at the high posterior right parietal lobe (series 8, image 85). No acute hemorrhage seen within this region on prior CT. Finding suggest prior subarachnoid hemorrhage at this location. No mass lesion, midline shift or mass effect. No hydrocephalus or extra-axial fluid collection. Pituitary gland within normal limits. Suprasellar region unremarkable. Midline structures intact and normal. Vascular: Major intracranial vascular flow voids are well maintained. Skull and upper cervical spine: Craniocervical junction within normal limits. Bone marrow signal intensity normal. No scalp soft tissue abnormality. Sinuses/Orbits: Prior bilateral ocular lens replacement with sequelae of prior scleral banding on the left. Globes and orbital soft tissues demonstrate no other acute finding. Mild mucosal thickening within the ethmoidal air cells. Paranasal sinuses are otherwise clear. Trace bilateral mastoid effusions, of doubtful significance. Visualized nasopharynx unremarkable. Other: 2.7 cm well-circumscribed heterogeneous mass seen within the left parotid gland (series 10, image 20), indeterminate. MRA HEAD FINDINGS Anterior circulation: Visualized distal cervical segments of the internal carotid arteries are patent with antegrade flow. Petrous, cavernous, and supraclinoid segments widely patent without stenosis or other abnormality. Origin of the ophthalmic arteries patent. A1 segments widely patent. Normal anterior communicating artery complex. Anterior cerebral arteries patent to their distal aspects without stenosis. No M1 stenosis or occlusion. Left M1 segments somewhat tortuous and superiorly directed. Normal MCA bifurcations. Distal MCA branches well perfused and symmetric. Posterior circulation: Dominant left vertebral artery widely patent to the vertebrobasilar junction. Right vertebral artery hypoplastic and largely  terminates in PICA, although a tiny branch ascending towards the vertebrobasilar junction. Both PICA patent. Basilar patent to its distal aspect without stenosis. Superior cerebral arteries patent bilaterally. Right PCA supplied via the basilar. Fetal type origin left PCA. Both PCAs perfused to their distal aspects without visible stenosis. Anatomic variants: Fetal type origin left PCA. No aneurysm or other vascular abnormality. IMPRESSION: MRI HEAD IMPRESSION: 1. No acute intracranial abnormality. 2. Chronic hemosiderin staining along a cortical sulcus at the high posterior right parietal lobe, suggesting prior subarachnoid hemorrhage at this location. 3. Underlying age-related cerebral atrophy with moderate chronic microvascular ischemic disease. 4. 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 Nonemergent ENT referral suggested for further workup and evaluation. MRA HEAD  IMPRESSION: Negative intracranial MRA. No large vessel occlusion, hemodynamically significant stenosis, or other acute vascular abnormality. Electronically Signed   By: Jeannine Boga M.D.   On: 03/16/2021 20:39   MR BRAIN WO CONTRAST  Result Date: 03/16/2021 CLINICAL DATA:  Initial evaluation for visual disturbance, dizziness, mental status change. EXAM: MRI HEAD WITHOUT CONTRAST MRA HEAD WITHOUT CONTRAST TECHNIQUE: Multiplanar, multi-echo pulse sequences of the brain and surrounding structures were acquired without intravenous contrast. Angiographic images of the Circle of Willis were acquired using MRA technique without intravenous contrast. COMPARISON:  CT from earlier the same day. FINDINGS: MRI HEAD FINDINGS Brain: Diffuse prominence of the CSF containing spaces compatible generalized age-related cerebral atrophy. Scattered patchy T2/FLAIR hyperintensity within the periventricular, deep, and subcortical white matter both cerebral hemispheres noted, most characteristic of chronic microvascular ischemic disease, moderate in nature. No  evidence for acute or subacute infarct. Gray-white matter differentiation maintained. No areas of remote cortical infarction. No acute intracranial hemorrhage. Chronic hemosiderin staining seen extending along a cortical sulcus at the high posterior right parietal lobe (series 8, image 85). No acute hemorrhage seen within this region on prior CT. Finding suggest prior subarachnoid hemorrhage at this location. No mass lesion, midline shift or mass effect. No hydrocephalus or extra-axial fluid collection. Pituitary gland within normal limits. Suprasellar region unremarkable. Midline structures intact and normal. Vascular: Major intracranial vascular flow voids are well maintained. Skull and upper cervical spine: Craniocervical junction within normal limits. Bone marrow signal intensity normal. No scalp soft tissue abnormality. Sinuses/Orbits: Prior bilateral ocular lens replacement with sequelae of prior scleral banding on the left. Globes and orbital soft tissues demonstrate no other acute finding. Mild mucosal thickening within the ethmoidal air cells. Paranasal sinuses are otherwise clear. Trace bilateral mastoid effusions, of doubtful significance. Visualized nasopharynx unremarkable. Other: 2.7 cm well-circumscribed heterogeneous mass seen within the left parotid gland (series 10, image 20), indeterminate. MRA HEAD FINDINGS Anterior circulation: Visualized distal cervical segments of the internal carotid arteries are patent with antegrade flow. Petrous, cavernous, and supraclinoid segments widely patent without stenosis or other abnormality. Origin of the ophthalmic arteries patent. A1 segments widely patent. Normal anterior communicating artery complex. Anterior cerebral arteries patent to their distal aspects without stenosis. No M1 stenosis or occlusion. Left M1 segments somewhat tortuous and superiorly directed. Normal MCA bifurcations. Distal MCA branches well perfused and symmetric. Posterior circulation:  Dominant left vertebral artery widely patent to the vertebrobasilar junction. Right vertebral artery hypoplastic and largely terminates in PICA, although a tiny branch ascending towards the vertebrobasilar junction. Both PICA patent. Basilar patent to its distal aspect without stenosis. Superior cerebral arteries patent bilaterally. Right PCA supplied via the basilar. Fetal type origin left PCA. Both PCAs perfused to their distal aspects without visible stenosis. Anatomic variants: Fetal type origin left PCA. No aneurysm or other vascular abnormality. IMPRESSION: MRI HEAD IMPRESSION: 1. No acute intracranial abnormality. 2. Chronic hemosiderin staining along a cortical sulcus at the high posterior right parietal lobe, suggesting prior subarachnoid hemorrhage at this location. 3. Underlying age-related cerebral atrophy with moderate chronic microvascular ischemic disease. 4. 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 Nonemergent ENT referral suggested for further workup and evaluation. MRA HEAD IMPRESSION: Negative intracranial MRA. No large vessel occlusion, hemodynamically significant stenosis, or other acute vascular abnormality. Electronically Signed   By: Jeannine Boga M.D.   On: 03/16/2021 20:39   CT HEAD CODE STROKE WO CONTRAST  Result Date: 03/16/2021 CLINICAL DATA:  Code stroke. Acute neuro deficit. Confusion, facial droop. EXAM: CT HEAD  WITHOUT CONTRAST TECHNIQUE: Contiguous axial images were obtained from the base of the skull through the vertex without intravenous contrast. COMPARISON:  None. FINDINGS: Brain: Generalized atrophy. Patchy white matter hypodensity bilaterally likely chronic. Negative for hydrocephalus. Negative for acute infarct, hemorrhage, mass. Vascular: Negative for hyperdense vessel. Skull: Negative Sinuses/Orbits: Negative Other: Motion degraded study ASPECTS ([place] Stroke Program Early CT Score) - Ganglionic level infarction (caudate, lentiform nuclei, internal capsule,  insula, M1-M3 cortex): 7 - Supraganglionic infarction (M4-M6 cortex): 3 Total score (0-10 with 10 being normal): 10 IMPRESSION: 1. No acute abnormality. 2. Motion degraded study 3. ASPECTS is 10 4. Code stroke imaging results were communicated on 03/16/2021 at 6:52 pm to provider [place] via text page Electronically Signed   By: Franchot Gallo M.D.   On: 03/16/2021 18:53    EKG: Independently reviewed. EKG shows sinus rhythm with no acute ST elevation or depression.  QTc 416  Assessment/Plan Principal Problem:   ARF (acute renal failure)  Mr. Sneider is admitted to med/surg floor.  Renal ultrasound ordered and pending to rule out obstruction or anatomical pathology.  IVF hydration with LR Recheck electrolytes and renal function in am.   Active Problems:   Hypertension Continue lisinopril. Monitor BP.     Diabetes mellitus type 2, uncontrolled  Continue with metformin. Monitor Blood sugar every 6 hours. Glycemic control with SSI as needed.  Check HgbA1c    CAD (coronary artery disease) Chronic     Dementia Chronic. Continue aricept and namenda.     DVT prophylaxis: Heparin for DVT prophylaxis Code Status:   Full Code Family Communication:  Diagnosis and plan discussed with patient's wife who is at bedside.  She verbalized understanding agrees with plan.  Further recommendations to follow as clinical indicated Disposition Plan:   Patient is from:  Home  Anticipated DC to:  Home  Anticipated DC date:  Anticipate 2 midnight or more stay in the hospital   Admission status:  Inpatient   Yevonne Aline Fredericka Bottcher MD [facility]  How to contact the [facility] Attending or Consulting provider [place] or covering provider during after hours [place], for this patient?   Check the care team in [facility] and look for a) attending/consulting [facility] provider listed and b) the [facility] team listed Log into [email or web address] and use [place]'s universal password to access. If you do not have the password,  please contact the hospital operator. Locate the [facility] provider you are looking for under [facility] and page to a number that you can be directly reached. If you still have difficulty reaching the provider, please page the [facility] (Director on Call) for the Hospitalists listed on amion for assistance.  03/16/2021, 9:55 PM

## 2021-03-16 NOTE — ED Triage Notes (Signed)
PT BIB [facility] for eval of ? Code Stroke. Pt w/ L sided facial droop, intermittent aphasia and reported inability to walk. Pt LKN per stroke RN is 1000A. Pt w/ obvious L sided facial droop at rest, does appear to resolve w/ smile. No unilateral weakness noted on exam

## 2021-03-16 NOTE — ED Provider Notes (Signed)
[facility] EMERGENCY DEPARTMENT Provider Note   CSN: [id number] Arrival date & time: 03/16/21  1827  An emergency department physician performed an initial assessment on this suspected stroke patient at [place].  History Chief Complaint  Patient presents with   Code Stroke    Leslie [name] is a 80 y.o. male hx of CAD, DM, HTN, NSTEMI here presenting with confusion. Patient's last normal was around 10 am. Patient was noted to have left facial droop and confusion.  Patient unable to answer much question for me.  Patient is not currently on blood thinners.  Code stroke was activated by EMS.  Patient was noted to have left facial droop and confusion and right-sided neglect  The history is provided by the patient.      Past Medical History:  Diagnosis Date   Arthritis    "MY HAND "   Cancer ([place])    HX OF PROSTATE CANCER   Complication of anesthesia    DIFFICULT WAKING    Coronary artery disease    Diabetes mellitus without complication (HCC)    GERD (gastroesophageal reflux disease)    H/O detached retina repair    Hypertension    Neuropathy    DIABETIC FEET   NSTEMI (non-ST elevated myocardial infarction) ([place])    Sleep apnea     Patient Active Problem List   Diagnosis Date Noted   Pain in the chest 09/01/2015   Chest pain 09/01/2015   Hypertension 09/01/2015   Diabetes mellitus with complication ([place]) [id number]   HLD (hyperlipidemia) 09/01/2015   Chest pain, unspecified    Coronary artery disease involving native coronary artery of native heart with angina pectoris ([place])     Past Surgical History:  Procedure Laterality Date   CATARACT EXTRACTION     CHOLECYSTECTOMY     CORONARY STENT PLACEMENT     LEG SURGERY Right    TOTAL KNEE ARTHROPLASTY Right        History reviewed. No pertinent family history.  Social History   Tobacco Use   Smoking status: Former    Types: Cigarettes    Quit date: 08/21/1993    Years since quitting: 27.5    Smokeless tobacco: Never  Substance Use Topics   Alcohol use: Yes    Comment: daily wine or mixed drink with dinner   Drug use: No    Home Medications Prior to Admission medications   Medication Sig Start Date End Date Taking? Authorizing Provider  aspirin EC 81 MG tablet Take 81 mg by mouth daily.    [provider]  atorvastatin (LIPITOR) 80 MG tablet Take 80 mg by mouth daily at 6 PM.    [provider]  clopidogrel (PLAVIX) 75 MG tablet Take 75 mg by mouth daily.    [provider]  donepezil (ARICEPT) 10 MG tablet Take 10 mg by mouth daily. Reported on 09/01/2015    [provider]  finasteride (PROSCAR) 5 MG tablet Take 5 mg by mouth daily.    [provider]  lisinopril (PRINIVIL,ZESTRIL) 5 MG tablet Take 5 mg by mouth daily.    [provider]  metFORMIN (GLUCOPHAGE) 500 MG tablet Take 1,000 mg by mouth 2 (two) times daily with a meal.    [provider]    Allergies    Patient has no known allergies.  Review of Systems   Review of Systems  Neurological:  Positive for speech difficulty.  Psychiatric/Behavioral:  Positive for confusion.   All other  systems reviewed and are negative.  Physical Exam Updated Vital Signs BP 131/65 (BP Location: Right Arm)   Pulse 93   Temp 98.9 F (37.2 C) (Oral)   Resp 20   Ht '5\' 9"'$  (1.753 m)   SpO2 94%   BMI 27.90 kg/m   Physical Exam Vitals and nursing note reviewed.  Constitutional:      Comments: Confusion   HENT:     Head: Normocephalic.     Nose: Nose normal.     Mouth/Throat:     Mouth: Mucous membranes are moist.  Eyes:     Extraocular Movements: Extraocular movements intact.     Pupils: Pupils are equal, round, and reactive to light.  Cardiovascular:     Rate and Rhythm: Normal rate and regular rhythm.     Pulses: Normal pulses.     Heart sounds: Normal heart sounds.  Pulmonary:     Effort: Pulmonary effort is normal.     Breath sounds: Normal breath  sounds.  Abdominal:     General: Abdomen is flat.     Palpations: Abdomen is soft.  Musculoskeletal:        General: Normal range of motion.     Cervical back: Normal range of motion and neck supple.  Skin:    General: Skin is warm.     Capillary Refill: Capillary refill takes less than 2 seconds.  Neurological:     Mental Status: He is alert.     Comments: Left facial droop. A & O x 1.  Strength is 5 out of 5 bilateral arms and legs  Psychiatric:        Mood and Affect: Mood normal.        Behavior: Behavior normal.    ED Results / Procedures / Treatments   Labs (all labs ordered are listed, but only abnormal results are displayed) Labs Reviewed  CBC - Abnormal; Notable for the following components:      Result Value   RBC 3.45 (*)    Hemoglobin 11.9 (*)    HCT 33.3 (*)    MCH 34.5 (*)    Platelets 126 (*)    All other components within normal limits  DIFFERENTIAL - Abnormal; Notable for the following components:   Neutro Abs 8.2 (*)    Lymphs Abs 0.3 (*)    Abs Immature Granulocytes 0.11 (*)    All other components within normal limits  COMPREHENSIVE METABOLIC PANEL - Abnormal; Notable for the following components:   Sodium 131 (*)    CO2 18 (*)    Glucose, Bld 359 (*)    BUN 47 (*)    Creatinine, Ser 3.20 (*)    Calcium 8.8 (*)    Total Protein 6.1 (*)    Albumin 3.2 (*)    GFR, Estimated 19 (*)    All other components within normal limits  I-STAT CHEM 8, ED - Abnormal; Notable for the following components:   Sodium 132 (*)    BUN 41 (*)    Creatinine, Ser 3.20 (*)    Glucose, Bld 358 (*)    TCO2 18 (*)    Hemoglobin 11.6 (*)    HCT 34.0 (*)    All other components within normal limits  RESP PANEL BY RT-PCR (FLU A&B, COVID) ARPGX2  ETHANOL  PROTIME-INR  APTT  RAPID URINE DRUG SCREEN, HOSP PERFORMED  URINALYSIS, ROUTINE W REFLEX MICROSCOPIC    EKG None  Radiology MR ANGIO HEAD WO CONTRAST  Result Date:  03/16/2021 CLINICAL DATA:  Initial  evaluation for visual disturbance, dizziness, mental status change. EXAM: MRI HEAD WITHOUT CONTRAST MRA HEAD WITHOUT CONTRAST TECHNIQUE: Multiplanar, multi-echo pulse sequences of the brain and surrounding structures were acquired without intravenous contrast. Angiographic images of the Circle of Willis were acquired using MRA technique without intravenous contrast. COMPARISON:  CT from earlier the same day. FINDINGS: MRI HEAD FINDINGS Brain: Diffuse prominence of the CSF containing spaces compatible generalized age-related cerebral atrophy. Scattered patchy T2/FLAIR hyperintensity within the periventricular, deep, and subcortical white matter both cerebral hemispheres noted, most characteristic of chronic microvascular ischemic disease, moderate in nature. No evidence for acute or subacute infarct. Gray-white matter differentiation maintained. No areas of remote cortical infarction. No acute intracranial hemorrhage. Chronic hemosiderin staining seen extending along a cortical sulcus at the high posterior right parietal lobe (series 8, image 85). No acute hemorrhage seen within this region on prior CT. Finding suggest prior subarachnoid hemorrhage at this location. No mass lesion, midline shift or mass effect. No hydrocephalus or extra-axial fluid collection. Pituitary gland within normal limits. Suprasellar region unremarkable. Midline structures intact and normal. Vascular: Major intracranial vascular flow voids are well maintained. Skull and upper cervical spine: Craniocervical junction within normal limits. Bone marrow signal intensity normal. No scalp soft tissue abnormality. Sinuses/Orbits: Prior bilateral ocular lens replacement with sequelae of prior scleral banding on the left. Globes and orbital soft tissues demonstrate no other acute finding. Mild mucosal thickening within the ethmoidal air cells. Paranasal sinuses are otherwise clear. Trace bilateral mastoid effusions, of doubtful significance.  Visualized nasopharynx unremarkable. Other: 2.7 cm well-circumscribed heterogeneous mass seen within the left parotid gland (series 10, image 20), indeterminate. MRA HEAD FINDINGS Anterior circulation: Visualized distal cervical segments of the internal carotid arteries are patent with antegrade flow. Petrous, cavernous, and supraclinoid segments widely patent without stenosis or other abnormality. Origin of the ophthalmic arteries patent. A1 segments widely patent. Normal anterior communicating artery complex. Anterior cerebral arteries patent to their distal aspects without stenosis. No M1 stenosis or occlusion. Left M1 segments somewhat tortuous and superiorly directed. Normal MCA bifurcations. Distal MCA branches well perfused and symmetric. Posterior circulation: Dominant left vertebral artery widely patent to the vertebrobasilar junction. Right vertebral artery hypoplastic and largely terminates in PICA, although a tiny branch ascending towards the vertebrobasilar junction. Both PICA patent. Basilar patent to its distal aspect without stenosis. Superior cerebral arteries patent bilaterally. Right PCA supplied via the basilar. Fetal type origin left PCA. Both PCAs perfused to their distal aspects without visible stenosis. Anatomic variants: Fetal type origin left PCA. No aneurysm or other vascular abnormality. IMPRESSION: MRI HEAD IMPRESSION: 1. No acute intracranial abnormality. 2. Chronic hemosiderin staining along a cortical sulcus at the high posterior right parietal lobe, suggesting prior subarachnoid hemorrhage at this location. 3. Underlying age-related cerebral atrophy with moderate chronic microvascular ischemic disease. 4. 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 Nonemergent ENT referral suggested for further workup and evaluation. MRA HEAD IMPRESSION: Negative intracranial MRA. No large vessel occlusion, hemodynamically significant stenosis, or other acute vascular abnormality. Electronically Signed    By: Jeannine Boga M.D.   On: 03/16/2021 20:39   MR BRAIN WO CONTRAST  Result Date: 03/16/2021 CLINICAL DATA:  Initial evaluation for visual disturbance, dizziness, mental status change. EXAM: MRI HEAD WITHOUT CONTRAST MRA HEAD WITHOUT CONTRAST TECHNIQUE: Multiplanar, multi-echo pulse sequences of the brain and surrounding structures were acquired without intravenous contrast. Angiographic images of the Circle of Willis were acquired using MRA technique without intravenous contrast. COMPARISON:  CT from earlier the same day. FINDINGS: MRI HEAD FINDINGS Brain: Diffuse prominence of the CSF containing spaces compatible generalized age-related cerebral atrophy. Scattered patchy T2/FLAIR hyperintensity within the periventricular, deep, and subcortical white matter both cerebral hemispheres noted, most characteristic of chronic microvascular ischemic disease, moderate in nature. No evidence for acute or subacute infarct. Gray-white matter differentiation maintained. No areas of remote cortical infarction. No acute intracranial hemorrhage. Chronic hemosiderin staining seen extending along a cortical sulcus at the high posterior right parietal lobe (series 8, image 85). No acute hemorrhage seen within this region on prior CT. Finding suggest prior subarachnoid hemorrhage at this location. No mass lesion, midline shift or mass effect. No hydrocephalus or extra-axial fluid collection. Pituitary gland within normal limits. Suprasellar region unremarkable. Midline structures intact and normal. Vascular: Major intracranial vascular flow voids are well maintained. Skull and upper cervical spine: Craniocervical junction within normal limits. Bone marrow signal intensity normal. No scalp soft tissue abnormality. Sinuses/Orbits: Prior bilateral ocular lens replacement with sequelae of prior scleral banding on the left. Globes and orbital soft tissues demonstrate no other acute finding. Mild mucosal thickening within the  ethmoidal air cells. Paranasal sinuses are otherwise clear. Trace bilateral mastoid effusions, of doubtful significance. Visualized nasopharynx unremarkable. Other: 2.7 cm well-circumscribed heterogeneous mass seen within the left parotid gland (series 10, image 20), indeterminate. MRA HEAD FINDINGS Anterior circulation: Visualized distal cervical segments of the internal carotid arteries are patent with antegrade flow. Petrous, cavernous, and supraclinoid segments widely patent without stenosis or other abnormality. Origin of the ophthalmic arteries patent. A1 segments widely patent. Normal anterior communicating artery complex. Anterior cerebral arteries patent to their distal aspects without stenosis. No M1 stenosis or occlusion. Left M1 segments somewhat tortuous and superiorly directed. Normal MCA bifurcations. Distal MCA branches well perfused and symmetric. Posterior circulation: Dominant left vertebral artery widely patent to the vertebrobasilar junction. Right vertebral artery hypoplastic and largely terminates in PICA, although a tiny branch ascending towards the vertebrobasilar junction. Both PICA patent. Basilar patent to its distal aspect without stenosis. Superior cerebral arteries patent bilaterally. Right PCA supplied via the basilar. Fetal type origin left PCA. Both PCAs perfused to their distal aspects without visible stenosis. Anatomic variants: Fetal type origin left PCA. No aneurysm or other vascular abnormality. IMPRESSION: MRI HEAD IMPRESSION: 1. No acute intracranial abnormality. 2. Chronic hemosiderin staining along a cortical sulcus at the high posterior right parietal lobe, suggesting prior subarachnoid hemorrhage at this location. 3. Underlying age-related cerebral atrophy with moderate chronic microvascular ischemic disease. 4. 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 Nonemergent ENT referral suggested for further workup and evaluation. MRA HEAD IMPRESSION: Negative intracranial MRA. No  large vessel occlusion, hemodynamically significant stenosis, or other acute vascular abnormality. Electronically Signed   By: Jeannine Boga M.D.   On: 03/16/2021 20:39   CT HEAD CODE STROKE WO CONTRAST  Result Date: 03/16/2021 CLINICAL DATA:  Code stroke. Acute neuro deficit. Confusion, facial droop. EXAM: CT HEAD WITHOUT CONTRAST TECHNIQUE: Contiguous axial images were obtained from the base of the skull through the vertex without intravenous contrast. COMPARISON:  None. FINDINGS: Brain: Generalized atrophy. Patchy white matter hypodensity bilaterally likely chronic. Negative for hydrocephalus. Negative for acute infarct, hemorrhage, mass. Vascular: Negative for hyperdense vessel. Skull: Negative Sinuses/Orbits: Negative Other: Motion degraded study ASPECTS ([place] Stroke Program Early CT Score) - Ganglionic level infarction (caudate, lentiform nuclei, internal capsule, insula, M1-M3 cortex): 7 - Supraganglionic infarction (M4-M6 cortex): 3 Total score (0-10 with 10 being normal): 10 IMPRESSION: 1. No acute  abnormality. 2. Motion degraded study 3. ASPECTS is 10 4. Code stroke imaging results were communicated on 03/16/2021 at 6:52 pm to provider [place] via text page Electronically Signed   By: Franchot Gallo M.D.   On: 03/16/2021 18:53    Procedures Procedures   Medications Ordered in ED Medications  LORazepam (ATIVAN) injection 1 mg (1 mg Intravenous Given 03/16/21 1931)  sodium chloride 0.9 % bolus 1,000 mL (1,000 mLs Intravenous New Bag/Given 03/16/21 1931)    ED Course  I have reviewed the triage vital signs and the nursing notes.  Pertinent labs & imaging results that were available during my care of the patient were reviewed by me and considered in my medical decision making (see chart for details).    MDM Rules/Calculators/A&P                          Leslie [name] is a 80 y.o. male facial droop and possible right visual field cut.  Last normal was 10 AM.  Patient outside of IV  tPA window.  However he may have an LVO.  Patient's creatinine is 3.2.  We will get a stat MRA brain and MRI brain.   9:02 PM Cr is 3.2.  I talked to the wife who is at bedside.  Patient has not been eating and drinking well.  His MRI did not show an acute stroke or LVO.  I talked to Dr. Malen Gauze from neurology.  He recommends admission for acute renal failure.  Patient likely uremic causing his altered mental status. No further neuro workup recommended at this time    Final Clinical Impression(s) / ED Diagnoses Final diagnoses:  None    Rx / DC Orders ED Discharge Orders     None        Drenda Freeze, MD 03/16/21 2105

## 2021-03-16 NOTE — Consult Note (Addendum)
NEURO HOSPITALIST CONSULT NOTE   Requestig physician: Dr. Darl Householder  Reason for Consult: Acute onset of confusion, left facial droop and leaning to the right.   History obtained from:  EMS and Chart     HPI:                                                                                                                                          Leslie [name] is an 80 y.o. male with a PMHx of arthritis, mild dementia, prostate cancer, CAD s/p stenting, DM, HTN, diabetic neuropathy and sleep apnea, presenting to the ED as a Code Stroke via EMS with acute onset of confusion, left facial droop and leaning to the right. At baseline he ambulates with a walker. LKN was 1000. Although he was leaning to the right, left sided weakness was also endorsed. CT head was negative for acute abnormality. He was out of the tPA time window. Exam not consistent with LVO and Cr elevated at 3.2, therefore decision was made to obtain MRI and MRA of brain instead of CTA.   Home medications include ASA, Plavix and atorvastatin. Also on donepezil and memantine.   Past Medical History:  Diagnosis Date   Arthritis    "MY HAND "   Cancer ([place])    HX OF PROSTATE CANCER   Complication of anesthesia    DIFFICULT WAKING    Coronary artery disease    Diabetes mellitus without complication (HCC)    GERD (gastroesophageal reflux disease)    H/O detached retina repair    Hypertension    Neuropathy    DIABETIC FEET   NSTEMI (non-ST elevated myocardial infarction) ([place])    Sleep apnea     Past Surgical History:  Procedure Laterality Date   CATARACT EXTRACTION     CHOLECYSTECTOMY     CORONARY STENT PLACEMENT     LEG SURGERY Right    TOTAL KNEE ARTHROPLASTY Right     History reviewed. No pertinent family history.           Social History:  reports that he quit smoking about 27 years ago. His smoking use included cigarettes. He has never used smokeless tobacco. He reports current alcohol use. He  reports that he does not use drugs.  No Known Allergies  MEDICATIONS:  No current facility-administered medications on file prior to encounter.   Current Outpatient Medications on File Prior to Encounter  Medication Sig Dispense Refill   alendronate (FOSAMAX) 70 MG tablet Take 70 mg by mouth every Monday.     aspirin EC 81 MG tablet Take 81 mg by mouth daily.     atorvastatin (LIPITOR) 40 MG tablet Take 40 mg by mouth every evening.     clopidogrel (PLAVIX) 75 MG tablet Take 75 mg by mouth daily.     Cyanocobalamin 2500 MCG SUBL Place 2,500 mg under the tongue daily.     donepezil (ARICEPT) 10 MG tablet Take 10 mg by mouth at bedtime.     doxycycline (MONODOX) 100 MG capsule Take 100 mg by mouth in the morning and at bedtime.     finasteride (PROSCAR) 5 MG tablet Take 5 mg by mouth daily.     gabapentin (NEURONTIN) 300 MG capsule Take 300 mg by mouth at bedtime.     insulin aspart protamine - aspart (NOVOLOG 70/30 MIX) (70-30) 100 UNIT/ML FlexPen Inject 20-25 Units into the skin in the morning and at bedtime. 25 units in the morning and 20 units in the evening     lisinopril (ZESTRIL) 5 MG tablet Take 5 mg by mouth daily.     memantine (NAMENDA XR) 14 MG CP24 24 hr capsule Take 14 mg by mouth daily.     metFORMIN (GLUCOPHAGE) 500 MG tablet Take 500 mg by mouth 2 (two) times daily with a meal.     nitroGLYCERIN (NITROSTAT) 0.4 MG SL tablet Place 0.4 mg under the tongue every 5 (five) minutes as needed for chest pain.     silver sulfADIAZINE (SILVADENE) 1 % cream Apply 1 application topically 2 (two) times daily.        ROS:                                                                                                                                       In the context of the patient's confusion, no other complaints are endorsed.    Blood pressure 129/69, pulse 82,  temperature 98.9 F (37.2 C), temperature source Oral, resp. rate 18, height '5\' 9"'$  (1.753 m), SpO2 99 %.   General Examination:                                                                                                       Physical Exam  HEENT-  [place]/AT  Lungs- Respirations  unlabored Extremities- Warm and well perfused  Neurological Examination Mental Status: Awake with mildly decreased level of alertness. Somewhat tangential and noncooperative. Dysthymic affect. Orientation is fair. Speech is sparse but fluent with equivocal dysarthria. Naming intact. No definite comprehension deficit. Follows several verbal commands but is overall poorly cooperative.  Cranial Nerves: II: PERRL. Right homonymous hemianopsia.   III,IV, VI: EOMI.  V: FT sensation equal bilaetrally. VII: Smile asymmetric at rest with decreased NL fold on the left, but equal contraction of perioral musculature when asked to smile and whistle.  VIII: HOH versus poorly cooperative IX,X: No hypophonia XI: Symmetric shoulder shrug XII: Midline tongue extension Motor: Right : Upper extremity   4+/5    Left:     Upper extremity   4+/5  Lower extremity   4+/5     Lower extremity   4+/5 Pill rolling tremor to right hand > left Right leg tremor also noted intermittently while at rest.  No cogwheeling Sensory: Temp and light touch intact throughout in proximal limbs, bilaterally Deep Tendon Reflexes: 1-2+ and symmetric throughout Plantars: Right: downgoing   Left: downgoing Cerebellar: No ataxia with FNF bilaterally, but slow Gait: Deferred   Lab Results: Basic Metabolic Panel: Recent Labs  Lab 03/16/21 1839  NA 131*  132*  K 4.0  4.1  CL 102  102  CO2 18*  GLUCOSE 359*  358*  BUN 47*  41*  CREATININE 3.20*  3.20*  CALCIUM 8.8*    CBC: Recent Labs  Lab 03/16/21 1839  WBC 9.3  NEUTROABS 8.2*  HGB 11.9*  11.6*  HCT 33.3*  34.0*  MCV 96.5  PLT 126*    Cardiac Enzymes: No results for  input(s): CKTOTAL, CKMB, CKMBINDEX, TROPONINI in the last 168 hours.  Lipid Panel: No results for input(s): CHOL, TRIG, HDL, CHOLHDL, VLDL, LDLCALC in the last 168 hours.  Imaging: MR ANGIO HEAD WO CONTRAST  Result Date: 03/16/2021 CLINICAL DATA:  Initial evaluation for visual disturbance, dizziness, mental status change. EXAM: MRI HEAD WITHOUT CONTRAST MRA HEAD WITHOUT CONTRAST TECHNIQUE: Multiplanar, multi-echo pulse sequences of the brain and surrounding structures were acquired without intravenous contrast. Angiographic images of the Circle of Willis were acquired using MRA technique without intravenous contrast. COMPARISON:  CT from earlier the same day. FINDINGS: MRI HEAD FINDINGS Brain: Diffuse prominence of the CSF containing spaces compatible generalized age-related cerebral atrophy. Scattered patchy T2/FLAIR hyperintensity within the periventricular, deep, and subcortical white matter both cerebral hemispheres noted, most characteristic of chronic microvascular ischemic disease, moderate in nature. No evidence for acute or subacute infarct. Gray-white matter differentiation maintained. No areas of remote cortical infarction. No acute intracranial hemorrhage. Chronic hemosiderin staining seen extending along a cortical sulcus at the high posterior right parietal lobe (series 8, image 85). No acute hemorrhage seen within this region on prior CT. Finding suggest prior subarachnoid hemorrhage at this location. No mass lesion, midline shift or mass effect. No hydrocephalus or extra-axial fluid collection. Pituitary gland within normal limits. Suprasellar region unremarkable. Midline structures intact and normal. Vascular: Major intracranial vascular flow voids are well maintained. Skull and upper cervical spine: Craniocervical junction within normal limits. Bone marrow signal intensity normal. No scalp soft tissue abnormality. Sinuses/Orbits: Prior bilateral ocular lens replacement with sequelae of  prior scleral banding on the left. Globes and orbital soft tissues demonstrate no other acute finding. Mild mucosal thickening within the ethmoidal air cells. Paranasal sinuses are otherwise clear. Trace bilateral mastoid effusions, of doubtful significance. Visualized nasopharynx unremarkable. Other: 2.7 cm  well-circumscribed heterogeneous mass seen within the left parotid gland (series 10, image 20), indeterminate. MRA HEAD FINDINGS Anterior circulation: Visualized distal cervical segments of the internal carotid arteries are patent with antegrade flow. Petrous, cavernous, and supraclinoid segments widely patent without stenosis or other abnormality. Origin of the ophthalmic arteries patent. A1 segments widely patent. Normal anterior communicating artery complex. Anterior cerebral arteries patent to their distal aspects without stenosis. No M1 stenosis or occlusion. Left M1 segments somewhat tortuous and superiorly directed. Normal MCA bifurcations. Distal MCA branches well perfused and symmetric. Posterior circulation: Dominant left vertebral artery widely patent to the vertebrobasilar junction. Right vertebral artery hypoplastic and largely terminates in PICA, although a tiny branch ascending towards the vertebrobasilar junction. Both PICA patent. Basilar patent to its distal aspect without stenosis. Superior cerebral arteries patent bilaterally. Right PCA supplied via the basilar. Fetal type origin left PCA. Both PCAs perfused to their distal aspects without visible stenosis. Anatomic variants: Fetal type origin left PCA. No aneurysm or other vascular abnormality. IMPRESSION: MRI HEAD IMPRESSION: 1. No acute intracranial abnormality. 2. Chronic hemosiderin staining along a cortical sulcus at the high posterior right parietal lobe, suggesting prior subarachnoid hemorrhage at this location. 3. Underlying age-related cerebral atrophy with moderate chronic microvascular ischemic disease. 4. 2.7 cm left parotid  mass, indeterminate. Nonemergent ENT referral suggested for further workup and evaluation. MRA HEAD IMPRESSION: Negative intracranial MRA. No large vessel occlusion, hemodynamically significant stenosis, or other acute vascular abnormality. Electronically Signed   By: Jeannine Boga M.D.   On: 03/16/2021 20:39   MR BRAIN WO CONTRAST  Result Date: 03/16/2021 CLINICAL DATA:  Initial evaluation for visual disturbance, dizziness, mental status change. EXAM: MRI HEAD WITHOUT CONTRAST MRA HEAD WITHOUT CONTRAST TECHNIQUE: Multiplanar, multi-echo pulse sequences of the brain and surrounding structures were acquired without intravenous contrast. Angiographic images of the Circle of Willis were acquired using MRA technique without intravenous contrast. COMPARISON:  CT from earlier the same day. FINDINGS: MRI HEAD FINDINGS Brain: Diffuse prominence of the CSF containing spaces compatible generalized age-related cerebral atrophy. Scattered patchy T2/FLAIR hyperintensity within the periventricular, deep, and subcortical white matter both cerebral hemispheres noted, most characteristic of chronic microvascular ischemic disease, moderate in nature. No evidence for acute or subacute infarct. Gray-white matter differentiation maintained. No areas of remote cortical infarction. No acute intracranial hemorrhage. Chronic hemosiderin staining seen extending along a cortical sulcus at the high posterior right parietal lobe (series 8, image 85). No acute hemorrhage seen within this region on prior CT. Finding suggest prior subarachnoid hemorrhage at this location. No mass lesion, midline shift or mass effect. No hydrocephalus or extra-axial fluid collection. Pituitary gland within normal limits. Suprasellar region unremarkable. Midline structures intact and normal. Vascular: Major intracranial vascular flow voids are well maintained. Skull and upper cervical spine: Craniocervical junction within normal limits. Bone marrow signal  intensity normal. No scalp soft tissue abnormality. Sinuses/Orbits: Prior bilateral ocular lens replacement with sequelae of prior scleral banding on the left. Globes and orbital soft tissues demonstrate no other acute finding. Mild mucosal thickening within the ethmoidal air cells. Paranasal sinuses are otherwise clear. Trace bilateral mastoid effusions, of doubtful significance. Visualized nasopharynx unremarkable. Other: 2.7 cm well-circumscribed heterogeneous mass seen within the left parotid gland (series 10, image 20), indeterminate. MRA HEAD FINDINGS Anterior circulation: Visualized distal cervical segments of the internal carotid arteries are patent with antegrade flow. Petrous, cavernous, and supraclinoid segments widely patent without stenosis or other abnormality. Origin of the ophthalmic arteries patent. A1 segments widely patent. Normal  anterior communicating artery complex. Anterior cerebral arteries patent to their distal aspects without stenosis. No M1 stenosis or occlusion. Left M1 segments somewhat tortuous and superiorly directed. Normal MCA bifurcations. Distal MCA branches well perfused and symmetric. Posterior circulation: Dominant left vertebral artery widely patent to the vertebrobasilar junction. Right vertebral artery hypoplastic and largely terminates in PICA, although a tiny branch ascending towards the vertebrobasilar junction. Both PICA patent. Basilar patent to its distal aspect without stenosis. Superior cerebral arteries patent bilaterally. Right PCA supplied via the basilar. Fetal type origin left PCA. Both PCAs perfused to their distal aspects without visible stenosis. Anatomic variants: Fetal type origin left PCA. No aneurysm or other vascular abnormality. IMPRESSION: MRI HEAD IMPRESSION: 1. No acute intracranial abnormality. 2. Chronic hemosiderin staining along a cortical sulcus at the high posterior right parietal lobe, suggesting prior subarachnoid hemorrhage at this location.  3. Underlying age-related cerebral atrophy with moderate chronic microvascular ischemic disease. 4. 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 Nonemergent ENT referral suggested for further workup and evaluation. MRA HEAD IMPRESSION: Negative intracranial MRA. No large vessel occlusion, hemodynamically significant stenosis, or other acute vascular abnormality. Electronically Signed   By: Jeannine Boga M.D.   On: 03/16/2021 20:39   CT HEAD CODE STROKE WO CONTRAST  Result Date: 03/16/2021 CLINICAL DATA:  Code stroke. Acute neuro deficit. Confusion, facial droop. EXAM: CT HEAD WITHOUT CONTRAST TECHNIQUE: Contiguous axial images were obtained from the base of the skull through the vertex without intravenous contrast. COMPARISON:  None. FINDINGS: Brain: Generalized atrophy. Patchy white matter hypodensity bilaterally likely chronic. Negative for hydrocephalus. Negative for acute infarct, hemorrhage, mass. Vascular: Negative for hyperdense vessel. Skull: Negative Sinuses/Orbits: Negative Other: Motion degraded study ASPECTS ([place] Stroke Program Early CT Score) - Ganglionic level infarction (caudate, lentiform nuclei, internal capsule, insula, M1-M3 cortex): 7 - Supraganglionic infarction (M4-M6 cortex): 3 Total score (0-10 with 10 being normal): 10 IMPRESSION: 1. No acute abnormality. 2. Motion degraded study 3. ASPECTS is 10 4. Code stroke imaging results were communicated on 03/16/2021 at 6:52 pm to provider Savier Trickett via text page Electronically Signed   By: Franchot Gallo M.D.   On: 03/16/2021 18:53    Assessment: 80 year old male with acute onset of confusion, left facial droop and leaning to the right. LKN was 1000. - Exam reveals right homonymous hemianopsia and moderate confusion. Speech pattern not consistent with a focal lesional aphasia. NIHSS 2 - CT head negative for acute abnormality.  - Out of the tPA time window.  - Exam not consistent with LVO and Cr elevated at 3.2, therefore decision made  to obtain MRI and MRA of brain instead of CTA.  -  MRI brain: No acute intracranial abnormality. Chronic hemosiderin staining along a cortical sulcus at the high posterior right parietal lobe, suggesting prior subarachnoid hemorrhage at this location. Underlying age-related cerebral atrophy with moderate chronic microvascular ischemic disease.  - Negative intracranial MRA - MRI also reveals a 2.7 cm left parotid mass, indeterminate. Nonemergent ENT referral suggested for further workup and evaluation. - Stroke risk factors: Cancer history, CAD, DM, HTN and sleep apnea - DDx includes TIA as the most likely etiology. Paradoxical confusion secondary to memantine versus metabolic encephalopathy (elevated BUN and Cr) is also on the DDx. No witnessed jerking or twitching to militate in favor of seizure.   Recommendations: 1. TIA work up to include TTE, carotid ultrasound and cardiac telemetry. 2. Continue ASA, Plavix and atorvastatin.  3. Modified permissive HTN protocol given advanced age. Treat SBP  if > 180.  4. Discontinue memantine. This dementia medication can parodoxically be associated with increased confusion.  5. Continue donepezil.  6. PT/OT/Speech 7. HgbA1c, fasting lipid panel 8. Frequent neuro checks 9. NPO until passes stroke swallow screen 10. Stroke team to follow in AM.  11. Outpatient Ophthalmology consult given visual field deficit on exam with negative MRI and MRA 12. Management of ARF per primary team.   Electronically signed: Dr. Kerney Elbe 03/16/2021, 10:33 PM

## 2021-03-17 ENCOUNTER — Inpatient Hospital Stay (HOSPITAL_COMMUNITY): Payer: Medicare HMO

## 2021-03-17 DIAGNOSIS — G309 Alzheimer's disease, unspecified: Secondary | ICD-10-CM

## 2021-03-17 DIAGNOSIS — G9341 Metabolic encephalopathy: Secondary | ICD-10-CM

## 2021-03-17 DIAGNOSIS — G459 Transient cerebral ischemic attack, unspecified: Secondary | ICD-10-CM

## 2021-03-17 DIAGNOSIS — F0280 Dementia in other diseases classified elsewhere without behavioral disturbance: Secondary | ICD-10-CM | POA: Diagnosis not present

## 2021-03-17 DIAGNOSIS — R410 Disorientation, unspecified: Secondary | ICD-10-CM | POA: Diagnosis not present

## 2021-03-17 DIAGNOSIS — N179 Acute kidney failure, unspecified: Secondary | ICD-10-CM | POA: Diagnosis not present

## 2021-03-17 LAB — URINALYSIS, ROUTINE W REFLEX MICROSCOPIC
Bilirubin Urine: NEGATIVE
Glucose, UA: >=500 mg/dL — AB
Ketones, ur: 5 mg/dL — AB
Nitrite: NEGATIVE
Protein, ur: 30 mg/dL — AB
Specific Gravity, Urine: 1.016 (ref 1.005–1.030)
pH: 5 (ref 5.0–8.0)

## 2021-03-17 LAB — BASIC METABOLIC PANEL
Anion gap: 9 (ref 5–15)
BUN: 45 mg/dL — ABNORMAL HIGH (ref 8–23)
CO2: 20 mmol/L — ABNORMAL LOW (ref 22–32)
Calcium: 8.7 mg/dL — ABNORMAL LOW (ref 8.9–10.3)
Chloride: 103 mmol/L (ref 98–111)
Creatinine, Ser: 2.86 mg/dL — ABNORMAL HIGH (ref 0.61–1.24)
GFR, Estimated: 22 mL/min — ABNORMAL LOW (ref >60)
Glucose, Bld: 301 mg/dL — ABNORMAL HIGH (ref 70–99)
Potassium: 3.9 mmol/L (ref 3.5–5.1)
Sodium: 132 mmol/L — ABNORMAL LOW (ref 135–145)

## 2021-03-17 LAB — CBC
HCT: 32.1 % — ABNORMAL LOW (ref 39.0–52.0)
Hemoglobin: 11.3 g/dL — ABNORMAL LOW (ref 13.0–17.0)
MCH: 34 pg (ref 26.0–34.0)
MCHC: 35.2 g/dL (ref 30.0–36.0)
MCV: 96.7 fL (ref 80.0–100.0)
Platelets: 131 10*3/uL — ABNORMAL LOW (ref 150–400)
RBC: 3.32 MIL/uL — ABNORMAL LOW (ref 4.22–5.81)
RDW: 12.5 % (ref 11.5–15.5)
WBC: 7.9 10*3/uL (ref 4.0–10.5)
nRBC: 0 % (ref 0.0–0.2)

## 2021-03-17 LAB — MAGNESIUM: Magnesium: 2 mg/dL (ref 1.7–2.4)

## 2021-03-17 LAB — RAPID URINE DRUG SCREEN, HOSP PERFORMED
Amphetamines: NOT DETECTED
Barbiturates: NOT DETECTED
Benzodiazepines: NOT DETECTED
Cocaine: NOT DETECTED
Opiates: NOT DETECTED
Tetrahydrocannabinol: NOT DETECTED

## 2021-03-17 LAB — LIPID PANEL
Cholesterol: 121 mg/dL (ref 0–200)
HDL: 38 mg/dL — ABNORMAL LOW (ref >40)
LDL Cholesterol: 60 mg/dL (ref 0–99)
Total CHOL/HDL Ratio: 3.2 RATIO
Triglycerides: 117 mg/dL (ref <150)
VLDL: 23 mg/dL (ref 0–40)

## 2021-03-17 LAB — ECHOCARDIOGRAM COMPLETE
Area-P 1/2: 4.68 cm2
Height: 69 in
S' Lateral: 2.1 cm
Single Plane A4C EF: 61.6 %

## 2021-03-17 LAB — GLUCOSE, CAPILLARY: Glucose-Capillary: 367 mg/dL — ABNORMAL HIGH (ref 70–99)

## 2021-03-17 MED ORDER — LABETALOL HCL 5 MG/ML IV SOLN: 10.0000 mg | INTRAVENOUS | Status: DC | PRN

## 2021-03-17 MED ORDER — PERFLUTREN LIPID MICROSPHERE
1.0000 mL | INTRAVENOUS | Status: AC | PRN
  Administered 2021-03-17: 2 mL via INTRAVENOUS
  Filled 2021-03-17: qty 10

## 2021-03-17 MED ORDER — HYDRALAZINE HCL 20 MG/ML IJ SOLN
10.0000 mg | Freq: Four times a day (QID) | INTRAMUSCULAR | Status: DC | PRN
  Administered 2021-03-17: 10 mg via INTRAVENOUS
  Filled 2021-03-17: qty 1

## 2021-03-17 NOTE — Progress Notes (Signed)
PROGRESS NOTE    Leslie [name]  [id number] DOB: 1941/08/21 DOA: 03/16/2021 PCP: Wallene Dales, MD   Brief Narrative:  HPI: Leslie [name] is a 80 y.o. male with medical history significant for HTN, DMT2, Dementia, CAD who presents from home by EMS for altered mental status.  According to his wife who is at the bedside patient has been very lethargic and somnolent all day.  He has not been talking as much as he normally does.  She reports that for the last week he has not been eating well and he has not had much to drink.  He was a code stroke when he presented but was not negative on MRI to have a stroke.  He was seen by neurology who did not feel the need to be any further work-up.  He was found to have acute renal failure on labs which is suspected be the cause of his altered mental status.  Wife reports he has not had any fever or chills.  She reports that yesterday he did sit outside on her back deck in the sun for a few hours and was not drinking much.  Not had any nausea, vomiting or diarrhea.  He states he has not had any complaints of chest pain or pressure in the last few days.   ED Course: In the emergency room patient is been somnolent and is unable to provide any history.  Wife is at the bedside and provides history.  CT of the head was negative for acute bleed.  MRI of the brain was negative for acute stroke and MRA of the brain showed no large vessel occlusion.  Lab work revealed an acute renal failure with a creatinine of 3.20 from a baseline of 0.87.  CBC is unremarkable.  Electrolytes were normal.  Hospitalist service asked to admit for further management.  ER is ordered and renal ultrasound which is pending  Assessment & Plan:   Principal Problem:   ARF (acute renal failure) (HCC) Active Problems:   Hypertension   Diabetes mellitus type 2, uncontrolled ([place])   CAD (coronary artery disease)   Dementia (HCC)  Acute metabolic encephalopathy: Granddaughter at the bedside.  Per  her, patient is slightly better than yesterday but still his mental status is worse than his baseline.  CT head, MRI brain, MRA head and neck all negative for stroke or any other pathology.  Doppler carotid and TTE pending.  This is likely due to severe dehydration.  Continue hydration.  Acute kidney injury/dehydration: Looks severely dehydrated clinically.  Presented with creatinine of 3.2, improved to 2.86.  Renal ultrasound shows simple right renal cyst and mild left-sided hydronephrosis.  Continue hydration, avoid nephrotoxic agent.  Discontinue lisinopril and metformin.  Essential hypertension: Labile but acceptable.  Add as needed hydralazine.  Type 2 diabetes mellitus: Controlled.  Continue SSI.  Hemoglobin A1c pending.  Mild acute hypovolemic hyponatremia: Sodium improving.  Transition from Ringer's lactate to normal saline.  Advanced dementia: Continue Aricept but neurology recommended discontinuing memantine as it can cause confusion however patient is still on that.  We will discontinue that.  CAD/hyperlipidemia: Asymptomatic, continue home medications of aspirin, Plavix and statin.  DVT prophylaxis: heparin injection 5,000 Units Start: 03/17/21 1400   Code Status: Full Code  Family Communication: Granddaughter present at bedside.  Plan of care discussed with her in length and he verbalized understanding and agreed with it.  Status is: Inpatient  Remains inpatient appropriate because:Inpatient level of care appropriate due to severity of illness  Dispo: The patient is from: Home              Anticipated d/c is to: SNF              Patient currently is not medically stable to d/c.   Difficult to place patient No        Estimated body mass index is 27.9 kg/m as calculated from the following:   Height as of this encounter: '5\' 9"'$  (1.753 m).   Weight as of 09/01/15: 85.7 kg.     Nutritional Assessment: Body mass index is 27.9 kg/m.Marland Kitchen Seen by dietician.  I agree with the  assessment and plan as outlined below: Nutrition Status:        .  Skin Assessment: I have examined the patient's skin and I agree with the wound assessment as performed by the wound care RN as outlined below:    Consultants:  None  Procedures:  None  Antimicrobials:  Anti-infectives (From admission, onward)    None          Subjective: Seen and examined.  Patient awake, granddaughter at the bedside.  Patient able to answer questions, he is oriented to self but not place.  Per granddaughter, he is typically oriented to place but not to time month, year and POTUS.  Very close to his baseline but visually looks very dehydrated.  Objective: Vitals:   03/17/21 0730 03/17/21 0930 03/17/21 0945 03/17/21 1116  BP: (!) 152/79 136/71 (!) 176/76   Pulse: (!) 102 (!) 101 100   Resp: (!) 23 (!) 25 12   Temp:    97.7 F (36.5 C)  TempSrc:    Oral  SpO2: 100% 100% 100%   Height:        Intake/Output Summary (Last 24 hours) at 03/17/2021 1131 Last data filed at 03/16/2021 2352 Gross per 24 hour  Intake 1000 ml  Output --  Net 1000 ml   There were no vitals filed for this visit.  Examination:  General exam: Appears calm and comfortable but dehydrated Respiratory system: Clear to auscultation. Respiratory effort normal. Cardiovascular system: S1 & S2 heard, RRR. No JVD, murmurs, rubs, gallops or clicks. No pedal edema. Gastrointestinal system: Abdomen is nondistended, soft and nontender. No organomegaly or masses felt. Normal bowel sounds heard. Central nervous system: Slightly lethargic and disoriented.  No focal deficit. Extremities: Symmetric 5 x 5 power. Skin: No rashes, lesions or ulcers  Data Reviewed: I have personally reviewed following labs and imaging studies  CBC: Recent Labs  Lab 03/16/21 1839 03/17/21 0433  WBC 9.3 7.9  NEUTROABS 8.2*  --   HGB 11.9*  11.6* 11.3*  HCT 33.3*  34.0* 32.1*  MCV 96.5 96.7  PLT 126* [id number]*   Basic Metabolic  Panel: Recent Labs  Lab 03/16/21 1839 03/17/21 0433  NA 131*  132* 132*  K 4.0  4.1 3.9  CL 102  102 103  CO2 18* 20*  GLUCOSE 359*  358* 301*  BUN 47*  41* 45*  CREATININE 3.20*  3.20* 2.86*  CALCIUM 8.8* 8.7*   GFR: CrCl cannot be calculated (Unknown ideal weight.). Liver Function Tests: Recent Labs  Lab 03/16/21 1839  AST 17  ALT 18  ALKPHOS 54  BILITOT 1.1  PROT 6.1*  ALBUMIN 3.2*   No results for input(s): LIPASE, AMYLASE in the last 168 hours. No results for input(s): AMMONIA in the last 168 hours. Coagulation Profile: Recent Labs  Lab 03/16/21 1839  INR 1.2  Cardiac Enzymes: No results for input(s): CKTOTAL, CKMB, CKMBINDEX, TROPONINI in the last 168 hours. BNP (last 3 results) No results for input(s): PROBNP in the last 8760 hours. HbA1C: No results for input(s): HGBA1C in the last 72 hours. CBG: No results for input(s): GLUCAP in the last 168 hours. Lipid Profile: Recent Labs    03/17/21 0433  CHOL 121  HDL 38*  LDLCALC 60  TRIG 117  CHOLHDL 3.2   Thyroid Function Tests: No results for input(s): TSH, T4TOTAL, FREET4, T3FREE, THYROIDAB in the last 72 hours. Anemia Panel: No results for input(s): VITAMINB12, FOLATE, FERRITIN, TIBC, IRON, RETICCTPCT in the last 72 hours. Sepsis Labs: No results for input(s): PROCALCITON, LATICACIDVEN in the last 168 hours.  Recent Results (from the past 240 hour(s))  Resp Panel by RT-PCR (Flu A&B, Covid) Nasopharyngeal Swab     Status: None   Collection Time: 03/16/21  7:35 PM   Specimen: Nasopharyngeal Swab; Nasopharyngeal(NP) swabs in vial transport medium  Result Value Ref Range Status   SARS Coronavirus 2 by RT PCR NEGATIVE NEGATIVE Final    Comment: (NOTE) SARS-CoV-2 target nucleic acids are NOT DETECTED.  The SARS-CoV-2 RNA is generally detectable in upper respiratory specimens during the acute phase of infection. The lowest concentration of SARS-CoV-2 viral copies this assay can detect is 138  copies/mL. A negative result does not preclude SARS-Cov-2 infection and should not be used as the sole basis for treatment or other patient management decisions. A negative result may occur with  improper specimen collection/handling, submission of specimen other than nasopharyngeal swab, presence of viral mutation(s) within the areas targeted by this assay, and inadequate number of viral copies(<138 copies/mL). A negative result must be combined with clinical observations, patient history, and epidemiological information. The expected result is Negative.  Fact Sheet for Patients:  EntrepreneurPulse.com.au  Fact Sheet for Healthcare Providers:  IncredibleEmployment.be  This test is no t yet approved or cleared by the [place] FDA and  has been authorized for detection and/or diagnosis of SARS-CoV-2 by FDA under an Emergency Use Authorization (EUA). This EUA will remain  in effect (meaning this test can be used) for the duration of the COVID-19 declaration under Section 564(b)(1) of the Act, 21 U.S.C.section 360bbb-3(b)(1), unless the authorization is terminated  or revoked sooner.       Influenza A by PCR NEGATIVE NEGATIVE Final   Influenza B by PCR NEGATIVE NEGATIVE Final    Comment: (NOTE) The Xpert Xpress SARS-CoV-2/FLU/RSV plus assay is intended as an aid in the diagnosis of influenza from Nasopharyngeal swab specimens and should not be used as a sole basis for treatment. Nasal washings and aspirates are unacceptable for Xpert Xpress SARS-CoV-2/FLU/RSV testing.  Fact Sheet for Patients: EntrepreneurPulse.com.au  Fact Sheet for Healthcare Providers: IncredibleEmployment.be  This test is not yet approved or cleared by the [place] FDA and has been authorized for detection and/or diagnosis of SARS-CoV-2 by FDA under an Emergency Use Authorization (EUA). This EUA will remain in effect (meaning  this test can be used) for the duration of the COVID-19 declaration under Section 564(b)(1) of the Act, 21 U.S.C. section 360bbb-3(b)(1), unless the authorization is terminated or revoked.  Performed at [facility], [place] [address], [place],  [address]       Radiology Studies: MR ANGIO HEAD WO CONTRAST  Result Date: 03/16/2021 CLINICAL DATA:  Initial evaluation for visual disturbance, dizziness, mental status change. EXAM: MRI HEAD WITHOUT CONTRAST MRA HEAD WITHOUT CONTRAST TECHNIQUE: Multiplanar, multi-echo pulse  sequences of the brain and surrounding structures were acquired without intravenous contrast. Angiographic images of the Circle of Willis were acquired using MRA technique without intravenous contrast. COMPARISON:  CT from earlier the same day. FINDINGS: MRI HEAD FINDINGS Brain: Diffuse prominence of the CSF containing spaces compatible generalized age-related cerebral atrophy. Scattered patchy T2/FLAIR hyperintensity within the periventricular, deep, and subcortical white matter both cerebral hemispheres noted, most characteristic of chronic microvascular ischemic disease, moderate in nature. No evidence for acute or subacute infarct. Gray-white matter differentiation maintained. No areas of remote cortical infarction. No acute intracranial hemorrhage. Chronic hemosiderin staining seen extending along a cortical sulcus at the high posterior right parietal lobe (series 8, image 85). No acute hemorrhage seen within this region on prior CT. Finding suggest prior subarachnoid hemorrhage at this location. No mass lesion, midline shift or mass effect. No hydrocephalus or extra-axial fluid collection. Pituitary gland within normal limits. Suprasellar region unremarkable. Midline structures intact and normal. Vascular: Major intracranial vascular flow voids are well maintained. Skull and upper cervical spine: Craniocervical junction within normal limits. Bone marrow signal intensity  normal. No scalp soft tissue abnormality. Sinuses/Orbits: Prior bilateral ocular lens replacement with sequelae of prior scleral banding on the left. Globes and orbital soft tissues demonstrate no other acute finding. Mild mucosal thickening within the ethmoidal air cells. Paranasal sinuses are otherwise clear. Trace bilateral mastoid effusions, of doubtful significance. Visualized nasopharynx unremarkable. Other: 2.7 cm well-circumscribed heterogeneous mass seen within the left parotid gland (series 10, image 20), indeterminate. MRA HEAD FINDINGS Anterior circulation: Visualized distal cervical segments of the internal carotid arteries are patent with antegrade flow. Petrous, cavernous, and supraclinoid segments widely patent without stenosis or other abnormality. Origin of the ophthalmic arteries patent. A1 segments widely patent. Normal anterior communicating artery complex. Anterior cerebral arteries patent to their distal aspects without stenosis. No M1 stenosis or occlusion. Left M1 segments somewhat tortuous and superiorly directed. Normal MCA bifurcations. Distal MCA branches well perfused and symmetric. Posterior circulation: Dominant left vertebral artery widely patent to the vertebrobasilar junction. Right vertebral artery hypoplastic and largely terminates in PICA, although a tiny branch ascending towards the vertebrobasilar junction. Both PICA patent. Basilar patent to its distal aspect without stenosis. Superior cerebral arteries patent bilaterally. Right PCA supplied via the basilar. Fetal type origin left PCA. Both PCAs perfused to their distal aspects without visible stenosis. Anatomic variants: Fetal type origin left PCA. No aneurysm or other vascular abnormality. IMPRESSION: MRI HEAD IMPRESSION: 1. No acute intracranial abnormality. 2. Chronic hemosiderin staining along a cortical sulcus at the high posterior right parietal lobe, suggesting prior subarachnoid hemorrhage at this location. 3.  Underlying age-related cerebral atrophy with moderate chronic microvascular ischemic disease. 4. 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 Nonemergent ENT referral suggested for further workup and evaluation. MRA HEAD IMPRESSION: Negative intracranial MRA. No large vessel occlusion, hemodynamically significant stenosis, or other acute vascular abnormality. Electronically Signed   By: Jeannine Boga M.D.   On: 03/16/2021 20:39   MR BRAIN WO CONTRAST  Result Date: 03/16/2021 CLINICAL DATA:  Initial evaluation for visual disturbance, dizziness, mental status change. EXAM: MRI HEAD WITHOUT CONTRAST MRA HEAD WITHOUT CONTRAST TECHNIQUE: Multiplanar, multi-echo pulse sequences of the brain and surrounding structures were acquired without intravenous contrast. Angiographic images of the Circle of Willis were acquired using MRA technique without intravenous contrast. COMPARISON:  CT from earlier the same day. FINDINGS: MRI HEAD FINDINGS Brain: Diffuse prominence of the CSF containing spaces compatible generalized age-related cerebral atrophy. Scattered patchy T2/FLAIR hyperintensity  within the periventricular, deep, and subcortical white matter both cerebral hemispheres noted, most characteristic of chronic microvascular ischemic disease, moderate in nature. No evidence for acute or subacute infarct. Gray-white matter differentiation maintained. No areas of remote cortical infarction. No acute intracranial hemorrhage. Chronic hemosiderin staining seen extending along a cortical sulcus at the high posterior right parietal lobe (series 8, image 85). No acute hemorrhage seen within this region on prior CT. Finding suggest prior subarachnoid hemorrhage at this location. No mass lesion, midline shift or mass effect. No hydrocephalus or extra-axial fluid collection. Pituitary gland within normal limits. Suprasellar region unremarkable. Midline structures intact and normal. Vascular: Major intracranial vascular flow voids  are well maintained. Skull and upper cervical spine: Craniocervical junction within normal limits. Bone marrow signal intensity normal. No scalp soft tissue abnormality. Sinuses/Orbits: Prior bilateral ocular lens replacement with sequelae of prior scleral banding on the left. Globes and orbital soft tissues demonstrate no other acute finding. Mild mucosal thickening within the ethmoidal air cells. Paranasal sinuses are otherwise clear. Trace bilateral mastoid effusions, of doubtful significance. Visualized nasopharynx unremarkable. Other: 2.7 cm well-circumscribed heterogeneous mass seen within the left parotid gland (series 10, image 20), indeterminate. MRA HEAD FINDINGS Anterior circulation: Visualized distal cervical segments of the internal carotid arteries are patent with antegrade flow. Petrous, cavernous, and supraclinoid segments widely patent without stenosis or other abnormality. Origin of the ophthalmic arteries patent. A1 segments widely patent. Normal anterior communicating artery complex. Anterior cerebral arteries patent to their distal aspects without stenosis. No M1 stenosis or occlusion. Left M1 segments somewhat tortuous and superiorly directed. Normal MCA bifurcations. Distal MCA branches well perfused and symmetric. Posterior circulation: Dominant left vertebral artery widely patent to the vertebrobasilar junction. Right vertebral artery hypoplastic and largely terminates in PICA, although a tiny branch ascending towards the vertebrobasilar junction. Both PICA patent. Basilar patent to its distal aspect without stenosis. Superior cerebral arteries patent bilaterally. Right PCA supplied via the basilar. Fetal type origin left PCA. Both PCAs perfused to their distal aspects without visible stenosis. Anatomic variants: Fetal type origin left PCA. No aneurysm or other vascular abnormality. IMPRESSION: MRI HEAD IMPRESSION: 1. No acute intracranial abnormality. 2. Chronic hemosiderin staining along a  cortical sulcus at the high posterior right parietal lobe, suggesting prior subarachnoid hemorrhage at this location. 3. Underlying age-related cerebral atrophy with moderate chronic microvascular ischemic disease. 4. 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 Nonemergent ENT referral suggested for further workup and evaluation. MRA HEAD IMPRESSION: Negative intracranial MRA. No large vessel occlusion, hemodynamically significant stenosis, or other acute vascular abnormality. Electronically Signed   By: Jeannine Boga M.D.   On: 03/16/2021 20:39   US Renal  Result Date: 03/16/2021 CLINICAL DATA:  Renal failure. EXAM: RENAL / URINARY TRACT ULTRASOUND COMPLETE COMPARISON:  None. FINDINGS: Right Kidney: Renal measurements: 10.7 cm x 5.1 cm x 4.6 cm = volume: 131.59 mL. Echogenicity within normal limits. A 2.3 cm x 2.0 cm x 2.5 cm anechoic structure is seen within the mid right kidney. No abnormal flow is seen within this region on color Doppler evaluation. No hydronephrosis is visualized. Left Kidney: Renal measurements: 9.7 cm x 5.3 cm x 3.9 cm = volume: 103.81 mL. Echogenicity within normal limits. No mass is visualized. Mild left-sided hydronephrosis is seen. Bladder: Appears normal for degree of bladder distention. Other: None. IMPRESSION: 1. Simple right renal cyst. 2. Mild left-sided hydronephrosis. Electronically Signed   By: Virgina Norfolk M.D.   On: 03/16/2021 22:33   CT HEAD CODE STROKE  WO CONTRAST  Result Date: 03/16/2021 CLINICAL DATA:  Code stroke. Acute neuro deficit. Confusion, facial droop. EXAM: CT HEAD WITHOUT CONTRAST TECHNIQUE: Contiguous axial images were obtained from the base of the skull through the vertex without intravenous contrast. COMPARISON:  None. FINDINGS: Brain: Generalized atrophy. Patchy white matter hypodensity bilaterally likely chronic. Negative for hydrocephalus. Negative for acute infarct, hemorrhage, mass. Vascular: Negative for hyperdense vessel. Skull: Negative  Sinuses/Orbits: Negative Other: Motion degraded study ASPECTS ([place] Stroke Program Early CT Score) - Ganglionic level infarction (caudate, lentiform nuclei, internal capsule, insula, M1-M3 cortex): 7 - Supraganglionic infarction (M4-M6 cortex): 3 Total score (0-10 with 10 being normal): 10 IMPRESSION: 1. No acute abnormality. 2. Motion degraded study 3. ASPECTS is 10 4. Code stroke imaging results were communicated on 03/16/2021 at 6:52 pm to provider [place] via text page Electronically Signed   By: Franchot Gallo M.D.   On: 03/16/2021 18:53   VAS US CAROTID  Result Date: 03/17/2021 Carotid Arterial Duplex Study Patient Name:  Leslie [name]  Date of Exam:   03/17/2021 Medical Rec #: [id number]      Accession #:    [id number] Date of Birth: September 03, 1940      Patient Gender: M Patient Age:   79Y Exam Location:  [facility] Procedure:      VAS US CAROTID Referring Phys: [id number] Rosalin Hawking --------------------------------------------------------------------------------  Indications:       TIA. Risk Factors:      Hypertension, hyperlipidemia, Diabetes, past history of                    smoking, coronary artery disease. Other Factors:     Dementia. Limitations        Today's exam was limited due to suboptimal scanning                    environment. Comparison Study:  No prior studies. Performing Technologist: Darlin Coco RDMS,RVT  Examination Guidelines: A complete evaluation includes B-mode imaging, spectral Doppler, color Doppler, and power Doppler as needed of all accessible portions of each vessel. Bilateral testing is considered an integral part of a complete examination. Limited examinations for reoccurring indications may be performed as noted.  Right Carotid Findings: +----------+--------+--------+--------+-------------------------+--------+           PSV cm/sEDV cm/sStenosisPlaque Description       Comments +----------+--------+--------+--------+-------------------------+--------+ CCA Prox   57      11                                                +----------+--------+--------+--------+-------------------------+--------+ CCA Distal66      10                                                +----------+--------+--------+--------+-------------------------+--------+ ICA Prox  86      13      1-39%   calcific and heterogenous         +----------+--------+--------+--------+-------------------------+--------+ ICA Distal93      20                                                +----------+--------+--------+--------+-------------------------+--------+  ECA       94      14                                                +----------+--------+--------+--------+-------------------------+--------+ +----------+--------+-------+----------------+-------------------+           PSV cm/sEDV cmsDescribe        Arm Pressure (mmHG) +----------+--------+-------+----------------+-------------------+ Subclavian100            Multiphasic, WNL                    +----------+--------+-------+----------------+-------------------+ +---------+--------+--+--------+-+---------+ VertebralPSV cm/s52EDV cm/s8Antegrade +---------+--------+--+--------+-+---------+  Left Carotid Findings: +----------+--------+--------+--------+-------------------------+--------+           PSV cm/sEDV cm/sStenosisPlaque Description       Comments +----------+--------+--------+--------+-------------------------+--------+ CCA Prox  93      11              heterogenous and smooth           +----------+--------+--------+--------+-------------------------+--------+ CCA Distal62      13                                                +----------+--------+--------+--------+-------------------------+--------+ ICA Prox  66      12      1-39%   calcific and heterogenous         +----------+--------+--------+--------+-------------------------+--------+ ICA Distal53      13                                                 +----------+--------+--------+--------+-------------------------+--------+ ECA       130     13                                                +----------+--------+--------+--------+-------------------------+--------+ +----------+--------+--------+----------------+-------------------+           PSV cm/sEDV cm/sDescribe        Arm Pressure (mmHG) +----------+--------+--------+----------------+-------------------+ [id number]              Multiphasic, WNL                    +----------+--------+--------+----------------+-------------------+ +---------+--------+--+--------+--+---------+ VertebralPSV cm/s43EDV cm/s13Antegrade +---------+--------+--+--------+--+---------+   Summary: Right Carotid: Velocities in the right ICA are consistent with a 1-39% stenosis. Left Carotid: Velocities in the left ICA are consistent with a 1-39% stenosis.               Non-hemodynamically significant plaque <50% noted in the CCA. Vertebrals:  Bilateral vertebral arteries demonstrate antegrade flow. Subclavians: Normal flow hemodynamics were seen in bilateral subclavian              arteries. *See table(s) above for measurements and observations.     Preliminary     Scheduled Meds:  aspirin EC  81 mg Oral Daily   atorvastatin  40 mg Oral QPM   clopidogrel  75 mg Oral Daily   donepezil  10 mg Oral QHS   finasteride  5 mg Oral Daily  heparin  5,000 Units Subcutaneous Q8H   memantine  14 mg Oral Daily   Continuous Infusions:  lactated ringers 100 mL/hr at 03/17/21 0530     LOS: 1 day   Time spent: 36 minutes   Darliss Cheney, MD [facility]  03/17/2021, 11:31 AM   How to contact the [facility] Attending or Consulting provider [place] or covering provider during after hours [place], for this patient?  Check the care team in [facility] and look for a) attending/consulting [facility] provider listed and b) the [facility] team listed. Page or secure chat [phone]. Log into  [email or web address] and use [place]'s universal password to access. If you do not have the password, please contact the hospital operator. Locate the [facility] provider you are looking for under [facility] and page to a number that you can be directly reached. If you still have difficulty reaching the provider, please page the [facility] (Director on Call) for the Hospitalists listed on amion for assistance.

## 2021-03-17 NOTE — ED Notes (Signed)
Nurse report given to floor nurse

## 2021-03-17 NOTE — Progress Notes (Signed)
Received a call from primary RN that patient's heart rate was around 130, he was shivering and his blood pressure was also elevated at 180/90.  Saw patient at bedside.  Granddaughter and wife at the bedside.  Patient is shivering and feeling cold but his temperature is normal at this point in time.  By the time arrived, his heart rate was around 115.  Patient denied any chest pain, shortness of breath or any other complaint.  He was fully alert but not oriented, like he was this morning.  Lungs clear to auscultation.  EKG was already ordered however good-quality EKG could not be completed due to constant shivering.  Ordered magnesium instead.  We will follow-up.  Per wife and granddaughter, patient usually likes warm temperature in the room and he typically shivers when it is cold but is shivering today is likely more than usual.  Room temperature was 72.  I turned of the temperature to 80.  Asked RN to provide with warm blankets.  Once his shivering calms down, we will do an EKG.

## 2021-03-17 NOTE — ED Notes (Signed)
New EKG sent

## 2021-03-17 NOTE — Progress Notes (Signed)
2D Echocardiogram has been performed.  Leslie [name] 03/17/2021, 11:02 AM
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
## 2021-03-17 NOTE — ED Notes (Addendum)
Physician Nevada Crane evaluated personally. And contacted Dr. Tonie Griffith.

## 2021-03-17 NOTE — Progress Notes (Signed)
Carotid duplex bilateral study completed.   Please see CV Proc for preliminary results.   Quantavia Frith, RDMS, RVT  

## 2021-03-17 NOTE — Progress Notes (Signed)
Inpatient Diabetes Program Recommendations  AACE/ADA: New Consensus Statement on Inpatient Glycemic Control (2015)  Target Ranges:  Prepandial:   less than 140 mg/dL      Peak postprandial:   less than 180 mg/dL (1-2 hours)      Critically ill patients:  140 - 180 mg/dL   Lab Results  Component Value Date   GLUCAP 264 (H) 09/01/2015    Review of Glycemic Control Results for JAYDIEN, SHEAR (MRN [id number]) as of 03/17/2021 11:35  Ref. Range 03/16/2021 18:39 03/17/2021 04:33  Glucose Latest Ref Range: 70 - 99 mg/dL 359 (H) 301 (H)   Diabetes history: DM 2 Outpatient Diabetes medications: 70/30 25 units qam , 20 units qpm, metformin 500 mg bid Current orders for Inpatient glycemic control:  Novolog 0-9 units Q6 hours prn  Inpatient Diabetes Program Recommendations:    -   D/c prn on Novolog orders, Novolog not being given for 300 glucose.  Thanks, Tama Headings RN, MSN, BC-ADM Inpatient Diabetes Coordinator Team Pager [phone] (8a-5p)

## 2021-03-17 NOTE — ED Notes (Signed)
Family updated as to patient's status.

## 2021-03-17 NOTE — TOC CAGE-AID Note (Signed)
Transition of Care [facility]) - CAGE-AID Screening   Patient Details  Name: Remmington Heckel MRN: [id number] Date of Birth: 26-May-1941  [facility]) CM/SW Contact:    Caralee Morea C Tarpley-Carter, [place] Phone Number: 03/17/2021, 8:56 AM   Clinical Narrative: Pt is unable to participate in Cage Aid. Pt has dementia, is inappropriate for assessment.  Etienne Millward Tarpley-Carter, MSW, LCSW-A Pronouns:  She/Her/Hers [facility] HealthTransitions of Care Clinical Social Worker Direct Number:  [phone] [email or web address]   CAGE-AID Screening: Substance Abuse Screening unable to be completed due to: : Patient unable to participate (Pt has dementia, is inappropriate for assessment.)

## 2021-03-17 NOTE — ED Notes (Signed)
Patient has a temp of 100.9 orally. I was unable to give PO tylenol due to failed swallow screen. I will give rectal tylenol once the bedside echocardiogram is finished.

## 2021-03-17 NOTE — ED Notes (Signed)
Dr Doristine Bosworth notified about patients short run of V-tach, second time today

## 2021-03-17 NOTE — ED Notes (Signed)
Dr Doristine Bosworth notified about patients short run of V-tach

## 2021-03-17 NOTE — Progress Notes (Signed)
Notified on call Dr. Cyd Silence via text page  that EKG results are in.

## 2021-03-18 DIAGNOSIS — N179 Acute kidney failure, unspecified: Secondary | ICD-10-CM | POA: Diagnosis not present

## 2021-03-18 LAB — CBC WITH DIFFERENTIAL/PLATELET
Abs Immature Granulocytes: 0.09 10*3/uL — ABNORMAL HIGH (ref 0.00–0.07)
Basophils Absolute: 0 10*3/uL (ref 0.0–0.1)
Basophils Relative: 0 %
Eosinophils Absolute: 0 10*3/uL (ref 0.0–0.5)
Eosinophils Relative: 0 %
HCT: 30.6 % — ABNORMAL LOW (ref 39.0–52.0)
Hemoglobin: 11.1 g/dL — ABNORMAL LOW (ref 13.0–17.0)
Immature Granulocytes: 1 %
Lymphocytes Relative: 4 %
Lymphs Abs: 0.3 10*3/uL — ABNORMAL LOW (ref 0.7–4.0)
MCH: 34.6 pg — ABNORMAL HIGH (ref 26.0–34.0)
MCHC: 36.3 g/dL — ABNORMAL HIGH (ref 30.0–36.0)
MCV: 95.3 fL (ref 80.0–100.0)
Monocytes Absolute: 0.7 10*3/uL (ref 0.1–1.0)
Monocytes Relative: 10 %
Neutro Abs: 6.3 10*3/uL (ref 1.7–7.7)
Neutrophils Relative %: 85 %
Platelets: 136 10*3/uL — ABNORMAL LOW (ref 150–400)
RBC: 3.21 MIL/uL — ABNORMAL LOW (ref 4.22–5.81)
RDW: 12.3 % (ref 11.5–15.5)
WBC: 7.5 10*3/uL (ref 4.0–10.5)
nRBC: 0 % (ref 0.0–0.2)

## 2021-03-18 LAB — BASIC METABOLIC PANEL
Anion gap: 10 (ref 5–15)
BUN: 41 mg/dL — ABNORMAL HIGH (ref 8–23)
CO2: 21 mmol/L — ABNORMAL LOW (ref 22–32)
Calcium: 8.5 mg/dL — ABNORMAL LOW (ref 8.9–10.3)
Chloride: 103 mmol/L (ref 98–111)
Creatinine, Ser: 2.53 mg/dL — ABNORMAL HIGH (ref 0.61–1.24)
GFR, Estimated: 25 mL/min — ABNORMAL LOW (ref >60)
Glucose, Bld: 234 mg/dL — ABNORMAL HIGH (ref 70–99)
Potassium: 3.6 mmol/L (ref 3.5–5.1)
Sodium: 134 mmol/L — ABNORMAL LOW (ref 135–145)

## 2021-03-18 LAB — GLUCOSE, CAPILLARY
Glucose-Capillary: 194 mg/dL — ABNORMAL HIGH (ref 70–99)
Glucose-Capillary: 229 mg/dL — ABNORMAL HIGH (ref 70–99)
Glucose-Capillary: 249 mg/dL — ABNORMAL HIGH (ref 70–99)
Glucose-Capillary: 292 mg/dL — ABNORMAL HIGH (ref 70–99)

## 2021-03-18 LAB — HEMOGLOBIN A1C
Hgb A1c MFr Bld: 10.8 % — ABNORMAL HIGH (ref 4.8–5.6)
Mean Plasma Glucose: 263 mg/dL

## 2021-03-18 MED ORDER — NITROGLYCERIN 0.4 MG SL SUBL: 0.4000 mg | SUBLINGUAL_TABLET | SUBLINGUAL | Status: DC | PRN

## 2021-03-18 MED ORDER — CHLORHEXIDINE GLUCONATE CLOTH 2 % EX PADS
6.0000 | MEDICATED_PAD | Freq: Every day | CUTANEOUS | Status: DC
  Administered 2021-03-18 – 2021-03-23 (×5): 6 via TOPICAL

## 2021-03-18 MED ORDER — INSULIN ASPART PROT & ASPART (70-30 MIX) 100 UNIT/ML ~~LOC~~ SUSP
20.0000 [IU] | Freq: Two times a day (BID) | SUBCUTANEOUS | Status: DC
  Administered 2021-03-18 – 2021-03-24 (×13): 20 [IU] via SUBCUTANEOUS
  Filled 2021-03-18 (×2): qty 10

## 2021-03-18 NOTE — Care Management Important Message (Signed)
Important Message  Patient Details  Name: Leslie [name] MRN: [id number] Date of Birth: April 21, 1941   Medicare Important Message Given:  Yes     Memory Argue 03/18/2021, 5:33 PM

## 2021-03-18 NOTE — Progress Notes (Signed)
PROGRESS NOTE    Leslie [name]  [id number] DOB: October 09, 1940 DOA: 03/16/2021 PCP: Wallene Dales, MD   Brief Narrative:  Leslie [name] is a 80 y.o. male with medical history significant for HTN, DMT2, Dementia, CAD who presented from home by EMS for altered mental status.  Reportedly for the last week he has not been eating well and he has not had much to drink.  Upon arrival, he was somnolent, a code stroke was called but work-up was negative.  He was seen by neurology who did not feel the need to be any further work-up.  He was found to have acute renal failure on labs which is suspected be the cause of his altered mental status.  No history of fever or chills.  Wife reports that yesterday he did sit outside on her back deck in the sun for a few hours and was not drinking much.    Assessment & Plan:   Principal Problem:   ARF (acute renal failure) (HCC) Active Problems:   Hypertension   Diabetes mellitus type 2, uncontrolled ([place])   CAD (coronary artery disease)   Dementia (HCC)  Acute metabolic encephalopathy: CT head, MRI brain, MRA head and neck all negative for stroke or any other pathology.  Doppler carotid negative for significant stenosis and transthoracic echo with normal ejection fraction and no diastolic dysfunction.  Patient is slightly better than yesterday, no family at bedside today to verify.  Acute kidney injury/dehydration: Still looks severely dehydrated clinically.  Presented with creatinine of 3.2, improved to 2.86.  Renal ultrasound shows simple right renal cyst and mild left-sided hydronephrosis.  Has only 900 cc output documented, not sure how accurate that is.  We will anchor Foley catheter today for accurate I's and O's.  Wonder if he has some degree of bladder outlet obstruction.  Type 2 diabetes mellitus: Hyperglycemic.  Seems to be taking NovoLog Mix 70/30 20-25 units twice daily at home.  Will resume at 20 units and continue SSI.  Essential hypertension:  Controlled.  Continue to hold lisinopril but continue as needed hydralazine.  Mild acute hypovolemic hyponatremia: Sodium improving.  Advanced dementia: Continue Aricept but neurology recommended discontinuing memantine.  CAD/hyperlipidemia: Asymptomatic, continue home medications of aspirin, Plavix and statin.  Asymptomatic bacteriuria: UA was checked last night has positive leukocytes but no nitrites and has some rare bacteria.  Patient has no urinary complaints, he is afebrile with no leukocytosis.  He is somewhat improved as well without antibiotics so doubt UTI but we we will watch closely.  DVT prophylaxis: heparin injection 5,000 Units Start: 03/17/21 1400   Code Status: Full Code  Family Communication: None at bedside today.  Will call granddaughter later.  Status is: Inpatient  Remains inpatient appropriate because:Inpatient level of care appropriate due to severity of illness  Dispo: The patient is from: Home              Anticipated d/c is to: SNF              Patient currently is not medically stable to d/c.   Difficult to place patient No   Estimated body mass index is 27.9 kg/m as calculated from the following:   Height as of this encounter: '5\' 9"'$  (1.753 m).   Weight as of 09/01/15: 85.7 kg.   Consultants:  None  Procedures:  None  Antimicrobials:  Anti-infectives (From admission, onward)    None          Subjective: Patient seen and examined.  Sleepy but easily arousable.  Tends to keep his eyes closed but answering questions appropriately.  He was able to tell me the year 2022 which he could not do yesterday.  He also told me he lives in [facility] which she could not do yesterday.  Still believes that he is in his home.  When I told him he is in the hospital, he asked appropriate question " what for".  Overall improved compared to yesterday.  He has advanced dementia.  Objective: Vitals:   03/17/21 1754 03/17/21 2126 03/18/21 0310 03/18/21 0440  BP:  (!) 150/78 123/66  115/72  Pulse: (!) 108 97  98  Resp: '18 19  18  '$ Temp: 98.2 F (36.8 C) 98.6 F (37 C) 98.7 F (37.1 C) 98.4 F (36.9 C)  TempSrc: Oral Oral Oral Oral  SpO2: 98% 98%  98%  Height:        Intake/Output Summary (Last 24 hours) at 03/18/2021 1133 Last data filed at 03/18/2021 0530 Gross per 24 hour  Intake 3114.93 ml  Output 900 ml  Net 2214.93 ml    There were no vitals filed for this visit.  Examination:  General exam: Appears calm and comfortable  Respiratory system: Clear to auscultation. Respiratory effort normal. Cardiovascular system: S1 & S2 heard, RRR. No JVD, murmurs, rubs, gallops or clicks. No pedal edema. Gastrointestinal system: Abdomen is nondistended, soft and nontender. No organomegaly or masses felt. Normal bowel sounds heard. Central nervous system: Alert but oriented x1-2.  No focal deficit. Extremities: Symmetric 5 x 5 power. Skin: No rashes, lesions or ulcers.   Data Reviewed: I have personally reviewed following labs and imaging studies  CBC: Recent Labs  Lab 03/16/21 1839 03/17/21 0433 03/18/21 0431  WBC 9.3 7.9 7.5  NEUTROABS 8.2*  --  6.3  HGB 11.9*  11.6* 11.3* 11.1*  HCT 33.3*  34.0* 32.1* 30.6*  MCV 96.5 96.7 95.3  PLT 126* 131* 136*    Basic Metabolic Panel: Recent Labs  Lab 03/16/21 1839 03/17/21 0433 03/17/21 1825 03/18/21 0431  NA 131*  132* 132*  --  134*  K 4.0  4.1 3.9  --  3.6  CL 102  102 103  --  103  CO2 18* 20*  --  21*  GLUCOSE 359*  358* 301*  --  234*  BUN 47*  41* 45*  --  41*  CREATININE 3.20*  3.20* 2.86*  --  2.53*  CALCIUM 8.8* 8.7*  --  8.5*  MG  --   --  2.0  --     GFR: CrCl cannot be calculated (Unknown ideal weight.). Liver Function Tests: Recent Labs  Lab 03/16/21 1839  AST 17  ALT 18  ALKPHOS 54  BILITOT 1.1  PROT 6.1*  ALBUMIN 3.2*    No results for input(s): LIPASE, AMYLASE in the last 168 hours. No results for input(s): AMMONIA in the last 168  hours. Coagulation Profile: Recent Labs  Lab 03/16/21 1839  INR 1.2    Cardiac Enzymes: No results for input(s): CKTOTAL, CKMB, CKMBINDEX, TROPONINI in the last 168 hours. BNP (last 3 results) No results for input(s): PROBNP in the last 8760 hours. HbA1C: Recent Labs    03/17/21 0433  HGBA1C 10.8*   CBG: Recent Labs  Lab 03/17/21 1744 03/18/21 0024 03/18/21 0553  GLUCAP 367* 292* 229*   Lipid Profile: Recent Labs    03/17/21 0433  CHOL 121  HDL 38*  LDLCALC 60  TRIG 117  CHOLHDL  3.2    Thyroid Function Tests: No results for input(s): TSH, T4TOTAL, FREET4, T3FREE, THYROIDAB in the last 72 hours. Anemia Panel: No results for input(s): VITAMINB12, FOLATE, FERRITIN, TIBC, IRON, RETICCTPCT in the last 72 hours. Sepsis Labs: No results for input(s): PROCALCITON, LATICACIDVEN in the last 168 hours.  Recent Results (from the past 240 hour(s))  Resp Panel by RT-PCR (Flu A&B, Covid) Nasopharyngeal Swab     Status: None   Collection Time: 03/16/21  7:35 PM   Specimen: Nasopharyngeal Swab; Nasopharyngeal(NP) swabs in vial transport medium  Result Value Ref Range Status   SARS Coronavirus 2 by RT PCR NEGATIVE NEGATIVE Final    Comment: (NOTE) SARS-CoV-2 target nucleic acids are NOT DETECTED.  The SARS-CoV-2 RNA is generally detectable in upper respiratory specimens during the acute phase of infection. The lowest concentration of SARS-CoV-2 viral copies this assay can detect is 138 copies/mL. A negative result does not preclude SARS-Cov-2 infection and should not be used as the sole basis for treatment or other patient management decisions. A negative result may occur with  improper specimen collection/handling, submission of specimen other than nasopharyngeal swab, presence of viral mutation(s) within the areas targeted by this assay, and inadequate number of viral copies(<138 copies/mL). A negative result must be combined with clinical observations, patient history,  and epidemiological information. The expected result is Negative.  Fact Sheet for Patients:  EntrepreneurPulse.com.au  Fact Sheet for Healthcare Providers:  IncredibleEmployment.be  This test is no t yet approved or cleared by the [place] FDA and  has been authorized for detection and/or diagnosis of SARS-CoV-2 by FDA under an Emergency Use Authorization (EUA). This EUA will remain  in effect (meaning this test can be used) for the duration of the COVID-19 declaration under Section 564(b)(1) of the Act, 21 U.S.C.section 360bbb-3(b)(1), unless the authorization is terminated  or revoked sooner.       Influenza A by PCR NEGATIVE NEGATIVE Final   Influenza B by PCR NEGATIVE NEGATIVE Final    Comment: (NOTE) The Xpert Xpress SARS-CoV-2/FLU/RSV plus assay is intended as an aid in the diagnosis of influenza from Nasopharyngeal swab specimens and should not be used as a sole basis for treatment. Nasal washings and aspirates are unacceptable for Xpert Xpress SARS-CoV-2/FLU/RSV testing.  Fact Sheet for Patients: EntrepreneurPulse.com.au  Fact Sheet for Healthcare Providers: IncredibleEmployment.be  This test is not yet approved or cleared by the [place] FDA and has been authorized for detection and/or diagnosis of SARS-CoV-2 by FDA under an Emergency Use Authorization (EUA). This EUA will remain in effect (meaning this test can be used) for the duration of the COVID-19 declaration under Section 564(b)(1) of the Act, 21 U.S.C. section 360bbb-3(b)(1), unless the authorization is terminated or revoked.  Performed at [facility], [place] [address], [place], [place] [address]        Radiology Studies: MR ANGIO HEAD WO CONTRAST  Result Date: 03/16/2021 CLINICAL DATA:  Initial evaluation for visual disturbance, dizziness, mental status change. EXAM: MRI HEAD WITHOUT CONTRAST MRA HEAD WITHOUT  CONTRAST TECHNIQUE: Multiplanar, multi-echo pulse sequences of the brain and surrounding structures were acquired without intravenous contrast. Angiographic images of the Circle of Willis were acquired using MRA technique without intravenous contrast. COMPARISON:  CT from earlier the same day. FINDINGS: MRI HEAD FINDINGS Brain: Diffuse prominence of the CSF containing spaces compatible generalized age-related cerebral atrophy. Scattered patchy T2/FLAIR hyperintensity within the periventricular, deep, and subcortical white matter both cerebral hemispheres noted, most characteristic of chronic  microvascular ischemic disease, moderate in nature. No evidence for acute or subacute infarct. Gray-white matter differentiation maintained. No areas of remote cortical infarction. No acute intracranial hemorrhage. Chronic hemosiderin staining seen extending along a cortical sulcus at the high posterior right parietal lobe (series 8, image 85). No acute hemorrhage seen within this region on prior CT. Finding suggest prior subarachnoid hemorrhage at this location. No mass lesion, midline shift or mass effect. No hydrocephalus or extra-axial fluid collection. Pituitary gland within normal limits. Suprasellar region unremarkable. Midline structures intact and normal. Vascular: Major intracranial vascular flow voids are well maintained. Skull and upper cervical spine: Craniocervical junction within normal limits. Bone marrow signal intensity normal. No scalp soft tissue abnormality. Sinuses/Orbits: Prior bilateral ocular lens replacement with sequelae of prior scleral banding on the left. Globes and orbital soft tissues demonstrate no other acute finding. Mild mucosal thickening within the ethmoidal air cells. Paranasal sinuses are otherwise clear. Trace bilateral mastoid effusions, of doubtful significance. Visualized nasopharynx unremarkable. Other: 2.7 cm well-circumscribed heterogeneous mass seen within the left parotid gland  (series 10, image 20), indeterminate. MRA HEAD FINDINGS Anterior circulation: Visualized distal cervical segments of the internal carotid arteries are patent with antegrade flow. Petrous, cavernous, and supraclinoid segments widely patent without stenosis or other abnormality. Origin of the ophthalmic arteries patent. A1 segments widely patent. Normal anterior communicating artery complex. Anterior cerebral arteries patent to their distal aspects without stenosis. No M1 stenosis or occlusion. Left M1 segments somewhat tortuous and superiorly directed. Normal MCA bifurcations. Distal MCA branches well perfused and symmetric. Posterior circulation: Dominant left vertebral artery widely patent to the vertebrobasilar junction. Right vertebral artery hypoplastic and largely terminates in PICA, although a tiny branch ascending towards the vertebrobasilar junction. Both PICA patent. Basilar patent to its distal aspect without stenosis. Superior cerebral arteries patent bilaterally. Right PCA supplied via the basilar. Fetal type origin left PCA. Both PCAs perfused to their distal aspects without visible stenosis. Anatomic variants: Fetal type origin left PCA. No aneurysm or other vascular abnormality. IMPRESSION: MRI HEAD IMPRESSION: 1. No acute intracranial abnormality. 2. Chronic hemosiderin staining along a cortical sulcus at the high posterior right parietal lobe, suggesting prior subarachnoid hemorrhage at this location. 3. Underlying age-related cerebral atrophy with moderate chronic microvascular ischemic disease. 4. 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 Nonemergent ENT referral suggested for further workup and evaluation. MRA HEAD IMPRESSION: Negative intracranial MRA. No large vessel occlusion, hemodynamically significant stenosis, or other acute vascular abnormality. Electronically Signed   By: Jeannine Boga M.D.   On: 03/16/2021 20:39   MR BRAIN WO CONTRAST  Result Date: 03/16/2021 CLINICAL DATA:   Initial evaluation for visual disturbance, dizziness, mental status change. EXAM: MRI HEAD WITHOUT CONTRAST MRA HEAD WITHOUT CONTRAST TECHNIQUE: Multiplanar, multi-echo pulse sequences of the brain and surrounding structures were acquired without intravenous contrast. Angiographic images of the Circle of Willis were acquired using MRA technique without intravenous contrast. COMPARISON:  CT from earlier the same day. FINDINGS: MRI HEAD FINDINGS Brain: Diffuse prominence of the CSF containing spaces compatible generalized age-related cerebral atrophy. Scattered patchy T2/FLAIR hyperintensity within the periventricular, deep, and subcortical white matter both cerebral hemispheres noted, most characteristic of chronic microvascular ischemic disease, moderate in nature. No evidence for acute or subacute infarct. Gray-white matter differentiation maintained. No areas of remote cortical infarction. No acute intracranial hemorrhage. Chronic hemosiderin staining seen extending along a cortical sulcus at the high posterior right parietal lobe (series 8, image 85). No acute hemorrhage seen within this region on prior  CT. Finding suggest prior subarachnoid hemorrhage at this location. No mass lesion, midline shift or mass effect. No hydrocephalus or extra-axial fluid collection. Pituitary gland within normal limits. Suprasellar region unremarkable. Midline structures intact and normal. Vascular: Major intracranial vascular flow voids are well maintained. Skull and upper cervical spine: Craniocervical junction within normal limits. Bone marrow signal intensity normal. No scalp soft tissue abnormality. Sinuses/Orbits: Prior bilateral ocular lens replacement with sequelae of prior scleral banding on the left. Globes and orbital soft tissues demonstrate no other acute finding. Mild mucosal thickening within the ethmoidal air cells. Paranasal sinuses are otherwise clear. Trace bilateral mastoid effusions, of doubtful significance.  Visualized nasopharynx unremarkable. Other: 2.7 cm well-circumscribed heterogeneous mass seen within the left parotid gland (series 10, image 20), indeterminate. MRA HEAD FINDINGS Anterior circulation: Visualized distal cervical segments of the internal carotid arteries are patent with antegrade flow. Petrous, cavernous, and supraclinoid segments widely patent without stenosis or other abnormality. Origin of the ophthalmic arteries patent. A1 segments widely patent. Normal anterior communicating artery complex. Anterior cerebral arteries patent to their distal aspects without stenosis. No M1 stenosis or occlusion. Left M1 segments somewhat tortuous and superiorly directed. Normal MCA bifurcations. Distal MCA branches well perfused and symmetric. Posterior circulation: Dominant left vertebral artery widely patent to the vertebrobasilar junction. Right vertebral artery hypoplastic and largely terminates in PICA, although a tiny branch ascending towards the vertebrobasilar junction. Both PICA patent. Basilar patent to its distal aspect without stenosis. Superior cerebral arteries patent bilaterally. Right PCA supplied via the basilar. Fetal type origin left PCA. Both PCAs perfused to their distal aspects without visible stenosis. Anatomic variants: Fetal type origin left PCA. No aneurysm or other vascular abnormality. IMPRESSION: MRI HEAD IMPRESSION: 1. No acute intracranial abnormality. 2. Chronic hemosiderin staining along a cortical sulcus at the high posterior right parietal lobe, suggesting prior subarachnoid hemorrhage at this location. 3. Underlying age-related cerebral atrophy with moderate chronic microvascular ischemic disease. 4. 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 Nonemergent ENT referral suggested for further workup and evaluation. MRA HEAD IMPRESSION: Negative intracranial MRA. No large vessel occlusion, hemodynamically significant stenosis, or other acute vascular abnormality. Electronically Signed    By: Jeannine Boga M.D.   On: 03/16/2021 20:39   US Renal  Result Date: 03/16/2021 CLINICAL DATA:  Renal failure. EXAM: RENAL / URINARY TRACT ULTRASOUND COMPLETE COMPARISON:  None. FINDINGS: Right Kidney: Renal measurements: 10.7 cm x 5.1 cm x 4.6 cm = volume: 131.59 mL. Echogenicity within normal limits. A 2.3 cm x 2.0 cm x 2.5 cm anechoic structure is seen within the mid right kidney. No abnormal flow is seen within this region on color Doppler evaluation. No hydronephrosis is visualized. Left Kidney: Renal measurements: 9.7 cm x 5.3 cm x 3.9 cm = volume: 103.81 mL. Echogenicity within normal limits. No mass is visualized. Mild left-sided hydronephrosis is seen. Bladder: Appears normal for degree of bladder distention. Other: None. IMPRESSION: 1. Simple right renal cyst. 2. Mild left-sided hydronephrosis. Electronically Signed   By: Virgina Norfolk M.D.   On: 03/16/2021 22:33   ECHOCARDIOGRAM COMPLETE  Result Date: 03/17/2021    ECHOCARDIOGRAM REPORT   Patient Name:   RYANN HANSBURY Date of Exam: 03/17/2021 Medical Rec #:  [id number]     Height:       69.0 in Accession #:    [id number]    Weight:       188.9 lb Date of Birth:  03-30-1941     BSA:  2.017 m Patient Age:    68 years      BP:           152/79 mmHg Patient Gender: M             HR:           95 bpm. Exam Location:  Inpatient Procedure: 2D Echo, Cardiac Doppler, Color Doppler and Intracardiac            Opacification Agent Indications:    TIA  History:        Patient has no prior history of Echocardiogram examinations.                 CAD, TIA and Stroke, Signs/Symptoms:Alzheimer's, Altered Mental                 Status and Chest Pain; Risk Factors:Hypertension, Diabetes and                 Dyslipidemia.  Sonographer:    Roseanna Rainbow RDCS Referring Phys: [id number] [facility]  Sonographer Comments: Technically difficult study due to poor echo windows, suboptimal parasternal window, suboptimal apical window, suboptimal subcostal window  and no parasternal window. Exremely difficult echo due to very low respiratory dependent windows. Patient could not follow directions to hold respirations. IMPRESSIONS  1. Very poor quality study due to patient's inability to follow commands. Globally there is normal RV/LV function.  2. Left ventricular ejection fraction, by estimation, is 65 to 70%. The left ventricle has normal function. Left ventricular endocardial border not optimally defined to evaluate regional wall motion. There is mild concentric left ventricular hypertrophy. Left ventricular diastolic function could not be evaluated.  3. Right ventricular systolic function is normal. The right ventricular size is normal. Tricuspid regurgitation signal is inadequate for assessing PA pressure.  4. The mitral valve was not well visualized. No evidence of mitral valve regurgitation.  5. The aortic valve was not well visualized. Aortic valve regurgitation is not visualized. No aortic stenosis is present.  6. The inferior vena cava is normal in size with greater than 50% respiratory variability, suggesting right atrial pressure of 3 mmHg. FINDINGS  Left Ventricle: Left ventricular ejection fraction, by estimation, is 65 to 70%. The left ventricle has normal function. Left ventricular endocardial border not optimally defined to evaluate regional wall motion. Definity contrast agent was given IV to delineate the left ventricular endocardial borders. The left ventricular internal cavity size was normal in size. There is mild concentric left ventricular hypertrophy. Left ventricular diastolic function could not be evaluated due to nondiagnostic images. Left ventricular diastolic function could not be evaluated. Right Ventricle: The right ventricular size is normal. No increase in right ventricular wall thickness. Right ventricular systolic function is normal. Tricuspid regurgitation signal is inadequate for assessing PA pressure. Left Atrium: Left atrial size was not  well visualized. Right Atrium: Right atrial size was not well visualized. Pericardium: Trivial pericardial effusion is present. Presence of pericardial fat pad. Mitral Valve: The mitral valve was not well visualized. No evidence of mitral valve regurgitation. Tricuspid Valve: The tricuspid valve is not well visualized. Tricuspid valve regurgitation is not demonstrated. Aortic Valve: The aortic valve was not well visualized. Aortic valve regurgitation is not visualized. No aortic stenosis is present. Pulmonic Valve: The pulmonic valve was not well visualized. Pulmonic valve regurgitation is not visualized. Aorta: The aortic root and ascending aorta are structurally normal, with no evidence of dilitation. Venous: The inferior vena cava is normal in size  with greater than 50% respiratory variability, suggesting right atrial pressure of 3 mmHg. IAS/Shunts: The interatrial septum was not well visualized.  LEFT VENTRICLE PLAX 2D LVIDd:         3.50 cm     Diastology LVIDs:         2.10 cm     LV e' medial:    13.10 cm/s LV PW:         1.10 cm     LV E/e' medial:  4.7 LV IVS:        1.30 cm     LV e' lateral:   5.98 cm/s LVOT diam:     1.90 cm     LV E/e' lateral: 10.3 LV SV:         38 LV SV Index:   19 LVOT Area:     2.84 cm  LV Volumes (MOD) LV vol d, MOD A4C: 64.9 ml LV vol s, MOD A4C: 24.9 ml LV SV MOD A4C:     64.9 ml RIGHT VENTRICLE             IVC RV S prime:     10.30 cm/s  IVC diam: 1.60 cm LEFT ATRIUM             Index      RIGHT ATRIUM          Index LA diam:        3.00 cm 1.49 cm/m RA Area:     8.02 cm LA Vol (A2C):   11.4 ml 5.65 ml/m RA Volume:   13.70 ml 6.79 ml/m LA Vol (A4C):   7.9 ml  3.92 ml/m LA Biplane Vol: 10.5 ml 5.21 ml/m  AORTIC VALVE LVOT Vmax:   71.70 cm/s LVOT Vmean:  50.100 cm/s LVOT VTI:    0.134 m  AORTA Ao Root diam: 3.40 cm Ao Asc diam:  3.50 cm MITRAL VALVE MV Area (PHT): 4.68 cm    SHUNTS MV Decel Time: 162 msec    Systemic VTI:  0.13 m MV E velocity: 61.70 cm/s  Systemic Diam:  1.90 cm MV A velocity: 99.00 cm/s MV E/A ratio:  0.62 Eleonore Chiquito MD Electronically signed by Eleonore Chiquito MD Signature Date/Time: 03/17/2021/12:50:03 PM    Final    CT HEAD CODE STROKE WO CONTRAST  Result Date: 03/16/2021 CLINICAL DATA:  Code stroke. Acute neuro deficit. Confusion, facial droop. EXAM: CT HEAD WITHOUT CONTRAST TECHNIQUE: Contiguous axial images were obtained from the base of the skull through the vertex without intravenous contrast. COMPARISON:  None. FINDINGS: Brain: Generalized atrophy. Patchy white matter hypodensity bilaterally likely chronic. Negative for hydrocephalus. Negative for acute infarct, hemorrhage, mass. Vascular: Negative for hyperdense vessel. Skull: Negative Sinuses/Orbits: Negative Other: Motion degraded study ASPECTS ([place] Stroke Program Early CT Score) - Ganglionic level infarction (caudate, lentiform nuclei, internal capsule, insula, M1-M3 cortex): 7 - Supraganglionic infarction (M4-M6 cortex): 3 Total score (0-10 with 10 being normal): 10 IMPRESSION: 1. No acute abnormality. 2. Motion degraded study 3. ASPECTS is 10 4. Code stroke imaging results were communicated on 03/16/2021 at 6:52 pm to provider [place] via text page Electronically Signed   By: Franchot Gallo M.D.   On: 03/16/2021 18:53   VAS US CAROTID  Result Date: 03/17/2021 Carotid Arterial Duplex Study Patient Name:  SHIVANG MCRIGHT  Date of Exam:   03/17/2021 Medical Rec #: [id number]      Accession #:    [id number] Date of Birth: 03/22/1941  Patient Gender: M Patient Age:   29Y Exam Location:  [facility] Procedure:      VAS US CAROTID Referring Phys: [id number] Rosalin Hawking --------------------------------------------------------------------------------  Indications:       TIA. Risk Factors:      Hypertension, hyperlipidemia, Diabetes, past history of                    smoking, coronary artery disease. Other Factors:     Dementia. Limitations        Today's exam was limited due to suboptimal  scanning                    environment. Comparison Study:  No prior studies. Performing Technologist: Darlin Coco RDMS,RVT  Examination Guidelines: A complete evaluation includes B-mode imaging, spectral Doppler, color Doppler, and power Doppler as needed of all accessible portions of each vessel. Bilateral testing is considered an integral part of a complete examination. Limited examinations for reoccurring indications may be performed as noted.  Right Carotid Findings: +----------+--------+--------+--------+-------------------------+--------+           PSV cm/sEDV cm/sStenosisPlaque Description       Comments +----------+--------+--------+--------+-------------------------+--------+ CCA Prox  57      11                                                +----------+--------+--------+--------+-------------------------+--------+ CCA Distal66      10                                                +----------+--------+--------+--------+-------------------------+--------+ ICA Prox  86      13      1-39%   calcific and heterogenous         +----------+--------+--------+--------+-------------------------+--------+ ICA Distal93      20                                                +----------+--------+--------+--------+-------------------------+--------+ ECA       94      14                                                +----------+--------+--------+--------+-------------------------+--------+ +----------+--------+-------+----------------+-------------------+           PSV cm/sEDV cmsDescribe        Arm Pressure (mmHG) +----------+--------+-------+----------------+-------------------+ Subclavian100            Multiphasic, WNL                    +----------+--------+-------+----------------+-------------------+ +---------+--------+--+--------+-+---------+ VertebralPSV cm/s52EDV cm/s8Antegrade +---------+--------+--+--------+-+---------+  Left Carotid Findings:  +----------+--------+--------+--------+-------------------------+--------+           PSV cm/sEDV cm/sStenosisPlaque Description       Comments +----------+--------+--------+--------+-------------------------+--------+ CCA Prox  93      11              heterogenous and smooth           +----------+--------+--------+--------+-------------------------+--------+ CCA Distal62  13                                                +----------+--------+--------+--------+-------------------------+--------+ ICA Prox  66      12      1-39%   calcific and heterogenous         +----------+--------+--------+--------+-------------------------+--------+ ICA Distal53      13                                                +----------+--------+--------+--------+-------------------------+--------+ ECA       130     13                                                +----------+--------+--------+--------+-------------------------+--------+ +----------+--------+--------+----------------+-------------------+           PSV cm/sEDV cm/sDescribe        Arm Pressure (mmHG) +----------+--------+--------+----------------+-------------------+ [id number]              Multiphasic, WNL                    +----------+--------+--------+----------------+-------------------+ +---------+--------+--+--------+--+---------+ VertebralPSV cm/s43EDV cm/s13Antegrade +---------+--------+--+--------+--+---------+   Summary: Right Carotid: Velocities in the right ICA are consistent with a 1-39% stenosis. Left Carotid: Velocities in the left ICA are consistent with a 1-39% stenosis.               Non-hemodynamically significant plaque <50% noted in the CCA. Vertebrals:  Bilateral vertebral arteries demonstrate antegrade flow. Subclavians: Normal flow hemodynamics were seen in bilateral subclavian              arteries. *See table(s) above for measurements and observations.     Preliminary      Scheduled Meds:  aspirin EC  81 mg Oral Daily   atorvastatin  40 mg Oral QPM   clopidogrel  75 mg Oral Daily   donepezil  10 mg Oral QHS   finasteride  5 mg Oral Daily   heparin  5,000 Units Subcutaneous Q8H   insulin aspart protamine- aspart  20 Units Subcutaneous BID WC   Continuous Infusions:  lactated ringers 100 mL/hr at 03/18/21 0235     LOS: 2 days   Time spent: 30 minutes   Darliss Cheney, MD [facility]  03/18/2021, 11:33 AM   How to contact the [facility] Attending or Consulting provider [place] or covering provider during after hours [employer] [place], for this patient?  Check the care team in [facility] and look for a) attending/consulting [facility] provider listed and b) the [facility] team listed. Page or secure chat [phone]-7P. Log into [email or web address] and use [place]'s universal password to access. If you do not have the password, please contact the hospital operator. Locate the [facility] provider you are looking for under [facility] and page to a number that you can be directly reached. If you still have difficulty reaching the provider, please page the [facility] (Director on Call) for the Hospitalists listed on amion for assistance.

## 2021-03-18 NOTE — Progress Notes (Signed)
Inpatient Diabetes Program Recommendations  AACE/ADA: New Consensus Statement on Inpatient Glycemic Control (2015)  Target Ranges:  Prepandial:   less than 140 mg/dL      Peak postprandial:   less than 180 mg/dL (1-2 hours)      Critically ill patients:  140 - 180 mg/dL   Lab Results  Component Value Date   GLUCAP 249 (H) 03/18/2021   HGBA1C 10.8 (H) 03/17/2021    Review of Glycemic Control Results for Leslie [name], Leslie [name] (MRN [id number]) as of 03/18/2021 13:57  Ref. Range 03/17/2021 17:44 03/18/2021 00:24 03/18/2021 05:53 03/18/2021 11:49  Glucose-Capillary Latest Ref Range: 70 - 99 mg/dL 367 (H) 292 (H) 229 (H) 249 (H)   Diabetes history: Type 2 DM Outpatient Diabetes medications: Novolog 70/30- 25 units QAM, 20 units QPM, Metformin 500 mg BID Current orders for Inpatient glycemic control: Novolog 70/30 20 units BID, Novolog 0-9 units Q6H PRN  Inpatient Diabetes Program Recommendations:   Patient has been missing doses of correction due to "PRN" status in MAR. Novolog 70/30 is not appropriate if patient has poor oral intake.   Consider changing: -Correction to Novolog 0-6 units Q4H until intake improves -Discontinue Novolog 70/30 -Add Levemir 14 units BID  Spoke with patient and patient's daughter regarding outpatient diabetes management. Patient was giving injections up until 1 week ago. Since intake has been poor, patient has withheld insulin.  Reviewed patient's current A1c of 10.8% up from 6.6% in April 2022. Explained what a A1c is and what it measures. Also reviewed goal A1c with patient, importance of good glucose control @ home, and blood sugar goals. Reviewed patho of DM, need for insulin, 70/30, when to hold, need to have meal with administration, vascular changes and commorbidities. Patient was not checking CBG per daughter's knowledge. Reviewed frequency of CBGs with daughter.  Family considering SNF for disposition at this time. Daughter can only provide one injection a day.  Feel that a safer insulin regimen may be warranted given patient's poor oral intake and current cognitive change.  No questions at this time. Secure chat sent to MD.   Francesco Runner, MSN, RNC-OB Diabetes Coordinator [phone] (8a-5p)

## 2021-03-19 DIAGNOSIS — N179 Acute kidney failure, unspecified: Secondary | ICD-10-CM | POA: Diagnosis not present

## 2021-03-19 LAB — CBC WITH DIFFERENTIAL/PLATELET
Abs Immature Granulocytes: 0.03 10*3/uL (ref 0.00–0.07)
Basophils Absolute: 0 10*3/uL (ref 0.0–0.1)
Basophils Relative: 1 %
Eosinophils Absolute: 0.2 10*3/uL (ref 0.0–0.5)
Eosinophils Relative: 3 %
HCT: 27.9 % — ABNORMAL LOW (ref 39.0–52.0)
Hemoglobin: 9.9 g/dL — ABNORMAL LOW (ref 13.0–17.0)
Immature Granulocytes: 1 %
Lymphocytes Relative: 12 %
Lymphs Abs: 0.6 10*3/uL — ABNORMAL LOW (ref 0.7–4.0)
MCH: 34.1 pg — ABNORMAL HIGH (ref 26.0–34.0)
MCHC: 35.5 g/dL (ref 30.0–36.0)
MCV: 96.2 fL (ref 80.0–100.0)
Monocytes Absolute: 0.6 10*3/uL (ref 0.1–1.0)
Monocytes Relative: 11 %
Neutro Abs: 3.8 10*3/uL (ref 1.7–7.7)
Neutrophils Relative %: 72 %
Platelets: 129 10*3/uL — ABNORMAL LOW (ref 150–400)
RBC: 2.9 MIL/uL — ABNORMAL LOW (ref 4.22–5.81)
RDW: 12.4 % (ref 11.5–15.5)
WBC: 5.2 10*3/uL (ref 4.0–10.5)
nRBC: 0 % (ref 0.0–0.2)

## 2021-03-19 LAB — BASIC METABOLIC PANEL
Anion gap: 7 (ref 5–15)
BUN: 36 mg/dL — ABNORMAL HIGH (ref 8–23)
CO2: 22 mmol/L (ref 22–32)
Calcium: 8.2 mg/dL — ABNORMAL LOW (ref 8.9–10.3)
Chloride: 108 mmol/L (ref 98–111)
Creatinine, Ser: 2.22 mg/dL — ABNORMAL HIGH (ref 0.61–1.24)
GFR, Estimated: 29 mL/min — ABNORMAL LOW (ref >60)
Glucose, Bld: 110 mg/dL — ABNORMAL HIGH (ref 70–99)
Potassium: 3.2 mmol/L — ABNORMAL LOW (ref 3.5–5.1)
Sodium: 137 mmol/L (ref 135–145)

## 2021-03-19 LAB — GLUCOSE, CAPILLARY
Glucose-Capillary: 106 mg/dL — ABNORMAL HIGH (ref 70–99)
Glucose-Capillary: 93 mg/dL (ref 70–99)
Glucose-Capillary: 124 mg/dL — ABNORMAL HIGH (ref 70–99)
Glucose-Capillary: 149 mg/dL — ABNORMAL HIGH (ref 70–99)

## 2021-03-19 MED ORDER — TAMSULOSIN HCL 0.4 MG PO CAPS
0.4000 mg | ORAL_CAPSULE | Freq: Every day | ORAL | Status: DC
  Administered 2021-03-19 – 2021-03-24 (×6): 0.4 mg via ORAL
  Filled 2021-03-19 (×6): qty 1

## 2021-03-19 MED ORDER — POTASSIUM CHLORIDE CRYS ER 20 MEQ PO TBCR
20.0000 meq | EXTENDED_RELEASE_TABLET | Freq: Once | ORAL | Status: AC
  Administered 2021-03-19: 20 meq via ORAL
  Filled 2021-03-19: qty 1

## 2021-03-19 MED ORDER — POTASSIUM CHLORIDE CRYS ER 20 MEQ PO TBCR
40.0000 meq | EXTENDED_RELEASE_TABLET | Freq: Once | ORAL | Status: AC
  Administered 2021-03-19: 40 meq via ORAL
  Filled 2021-03-19: qty 2

## 2021-03-19 NOTE — Evaluation (Signed)
Physical Therapy Evaluation Patient Details Name: Leslie [name] MRN: [id number] DOB: 09-May-1941 Today's Date: 03/19/2021   History of Present Illness  Pt is an 80 y.o. male admitted 7/27 with acute renal failure and AMS. PMH:  HTN, DMT2, Dementia, CAD  Clinical Impression  Pt admitted with above diagnosis. PTA pt lived at home with his wife, mod I mobility with/without RW and wife assisted with ADLs, as needed. On eval, pt required mod assist bed mobility, mod assist sit to stand, and min assist ambulation/sidestepping 3' with RW. Pt declining OOB or further ambulation due to fatigue. Pt currently with functional limitations due to the deficits listed below (see PT Problem List). Pt will benefit from skilled PT to increase their independence and safety with mobility to allow discharge to the venue listed below.       Follow Up Recommendations SNF;Supervision/Assistance - 24 hour    Equipment Recommendations  Wheelchair (measurements PT)    Recommendations for Other Services       Precautions / Restrictions Precautions Precautions: Fall      Mobility  Bed Mobility Overal bed mobility: Needs Assistance Bed Mobility: Supine to Sit;Sit to Supine     Supine to sit: Mod assist;HOB elevated Sit to supine: Min assist   General bed mobility comments: +rail, increased time, cues for sequencing    Transfers Overall transfer level: Needs assistance Equipment used: Rolling walker (2 wheeled) Transfers: Sit to/from Stand Sit to Stand: Mod assist         General transfer comment: assist to power up and stabilize balance  Ambulation/Gait Ambulation/Gait assistance: Min assist Gait Distance (Feet): 3 Feet Assistive device: Rolling walker (2 wheeled) Gait Pattern/deviations: Step-to pattern     General Gait Details: sidestepping with RW bedside for improved positioning prior to return to supine. Pt declining further mobility.  Stairs            Wheelchair Mobility     Modified Rankin (Stroke Patients Only)       Balance Overall balance assessment: Needs assistance Sitting-balance support: Feet supported;No upper extremity supported Sitting balance-Leahy Scale: Fair     Standing balance support: Bilateral upper extremity supported;During functional activity Standing balance-Leahy Scale: Poor Standing balance comment: reliant on external support                             Pertinent Vitals/Pain Pain Assessment: Faces Faces Pain Scale: Hurts little more Pain Location: back (chronic) Pain Descriptors / Indicators: Grimacing;Discomfort Pain Intervention(s): Limited activity within patient's tolerance;Monitored during session;Repositioned    Home Living Family/patient expects to be discharged to:: Skilled nursing facility Living Arrangements: Spouse/significant other Available Help at Discharge: Family;Available 24 hours/day Type of Home: House Home Access: Stairs to enter Entrance Stairs-Rails: Right Entrance Stairs-Number of Steps: 3 Home Layout: One level Home Equipment: Walker - 2 wheels;Shower seat      Prior Function Level of Independence: Needs assistance   Gait / Transfers Assistance Needed: ambulated household distances with/without RW  ADL's / Homemaking Assistance Needed: wife assisted with ADLs as needed, primarily in/out of shower        Hand Dominance        Extremity/Trunk Assessment   Upper Extremity Assessment Upper Extremity Assessment: Defer to OT evaluation    Lower Extremity Assessment Lower Extremity Assessment: Generalized weakness    Cervical / Trunk Assessment Cervical / Trunk Assessment: Kyphotic  Communication   Communication: No difficulties  Cognition Arousal/Alertness: Awake/alert Behavior During Therapy:  WFL for tasks assessed/performed Overall Cognitive Status: History of cognitive impairments - at baseline                                 General Comments: Son  present in room to confirm baseline. Pt oriented to self and year. Follows simple commands.      General Comments General comments (skin integrity, edema, etc.): Pt on 2L O2 on arrival. Removed and mobilized on RA with SpO2 97%. 2L replaced upon return to bed due to pt preparing to take a nap.    Exercises     Assessment/Plan    PT Assessment Patient needs continued PT services  PT Problem List Decreased strength;Decreased mobility;Decreased knowledge of precautions;Decreased activity tolerance;Pain;Decreased balance       PT Treatment Interventions Therapeutic activities;Gait training;Therapeutic exercise;Patient/family education;Balance training;Functional mobility training;Stair training    PT Goals (Current goals can be found in the Care Plan section)  Acute Rehab PT Goals Patient Stated Goal: not stated PT Goal Formulation: With patient/family Time For Goal Achievement: 04/02/21 Potential to Achieve Goals: Fair    Frequency Min 2X/week   Barriers to discharge        Co-evaluation               AM-PAC PT "6 Clicks" Mobility  Outcome Measure Help needed turning from your back to your side while in a flat bed without using bedrails?: A Little Help needed moving from lying on your back to sitting on the side of a flat bed without using bedrails?: A Lot Help needed moving to and from a bed to a chair (including a wheelchair)?: A Lot Help needed standing up from a chair using your arms (e.g., wheelchair or bedside chair)?: A Lot Help needed to walk in hospital room?: A Little Help needed climbing 3-5 steps with a railing? : A Lot 6 Click Score: 14    End of Session Equipment Utilized During Treatment: Gait belt;Oxygen Activity Tolerance: Patient limited by fatigue Patient left: in bed;with call bell/phone within reach;with bed alarm set;with family/visitor present Nurse Communication: Mobility status PT Visit Diagnosis: Muscle weakness (generalized)  (M62.81);Pain    Time: 1045-1106 PT Time Calculation (min) (ACUTE ONLY): 21 min   Charges:   PT Evaluation $PT Eval Moderate Complexity: 1 Mod          Lorrin Goodell, PT  Office # [phone] Pager [phone]   Lorriane Shire 03/19/2021, 12:30 PM

## 2021-03-19 NOTE — Evaluation (Signed)
Occupational Therapy Evaluation Patient Details Name: Leslie [name] MRN: [id number] DOB: 20-Dec-1940 Today's Date: 03/19/2021    History of Present Illness Pt is an 80 y.o. male admitted 7/27 with acute renal failure and AMS. PMH:  HTN, DMT2, Dementia, CAD   Clinical Impression   Patient admitted for the above diagnosis.  Per the spouse, he was fairly independent with ADL and mobility until a few weeks ago.  Spouse notes decreased PO intake at that time.  Spouse can provide assist with meals and medications, but is unable to physically assist him at his current level, so SNF is recommended for post acute rehab.  Currently he is needing up to Mod A for bed mobility, Min A to stand, and [employer] for a simple stand pivot transfers.  He is needing up to Mod A for lower body ADL at sit/stand level.  Barriers are listed below, and OT is indicated in the acute level to maximize his functional status.      Follow Up Recommendations  SNF    Equipment Recommendations  None recommended by OT    Recommendations for Other Services       Precautions / Restrictions Precautions Precautions: Fall Restrictions Weight Bearing Restrictions: No      Mobility Bed Mobility Overal bed mobility: Needs Assistance Bed Mobility: Supine to Sit     Supine to sit: Mod assist       Patient Response: Cooperative  Transfers Overall transfer level: Needs assistance Equipment used: Rolling walker (2 wheeled) Transfers: Sit to/from [employer] Sit to Stand: Min assist Stand pivot transfers: Min guard       General transfer comment: increased time to scoot hips to the edge of the bed.  Min A to power up, but transfer to recliner with Min Gur[place] and RW.    Balance Overall balance assessment: Needs assistance Sitting-balance support: Feet supported;No upper extremity supported Sitting balance-Leahy Scale: Fair   Postural control: Posterior lean Standing balance support: Bilateral  upper extremity supported;During functional activity Standing balance-Leahy Scale: Poor Standing balance comment: reliant on external support                           ADL either performed or assessed with clinical judgement   ADL Overall ADL's : Needs assistance/impaired Eating/Feeding: Set up;Sitting   Grooming: Wash/dry hands;Wash/dry face;Set up;Sitting           Upper Body Dressing : Minimal assistance;Sitting   Lower Body Dressing: Moderate assistance;Sit to/from stand                       Vision Patient Visual Report: No change from baseline       Perception     Praxis      Pertinent Vitals/Pain Pain Assessment: Faces Faces Pain Scale: Hurts a little bit Pain Location: back (chronic) Pain Descriptors / Indicators: Grimacing;Discomfort Pain Intervention(s): Monitored during session     Hand Dominance Right   Extremity/Trunk Assessment Upper Extremity Assessment Upper Extremity Assessment: Generalized weakness   Lower Extremity Assessment Lower Extremity Assessment: Generalized weakness   Cervical / Trunk Assessment Cervical / Trunk Assessment: Kyphotic   Communication Communication Communication: No difficulties   Cognition Arousal/Alertness: Awake/alert Behavior During Therapy: WFL for tasks assessed/performed Overall Cognitive Status: History of cognitive impairments - at baseline  Home Living Family/patient expects to be discharged to:: Skilled nursing facility Living Arrangements: Spouse/significant other Available Help at Discharge: Family;Available 24 hours/day Type of Home: House Home Access: Stairs to enter [employer] of Steps: 3 Entrance Stairs-Rails: Right Home Layout: One level     Bathroom Shower/Tub: Occupational psychologist: Standard     Home Equipment: Environmental consultant - 2 wheels;Shower seat   Additional Comments:  shower seat does not fit into the shower well... increased difficulty for him to get in/out, so spouse assists getting in/out, and provides supervision.      Prior Functioning/Environment Level of Independence: Needs assistance  Gait / Transfers Assistance Needed: ambulated household distances with/without RW ADL's / Homemaking Assistance Needed: wife assisted with ADLs as needed, primarily in/out of shower            OT Problem List: Decreased strength;Decreased activity tolerance;Impaired balance (sitting and/or standing);Decreased safety awareness      OT Treatment/Interventions: Self-care/ADL training;Therapeutic exercise;Energy conservation;DME and/or AE instruction;Balance training;Therapeutic activities    OT Goals(Current goals can be found in the care plan section) Acute Rehab OT Goals Patient Stated Goal: I need to move better I guess OT Goal Formulation: With patient Time For Goal Achievement: 04/02/21 Potential to Achieve Goals: Good ADL Goals Pt Will Perform Grooming: with supervision;standing Pt Will Perform Upper Body Bathing: with set-up;standing;sitting Pt Will Perform Upper Body Dressing: with set-up;sitting Pt Will Transfer to Toilet: with supervision;ambulating;regular height toilet Pt Will Perform Toileting - Clothing Manipulation and hygiene: with min guard assist;sit to/from stand Pt/caregiver will Perform Home Exercise Program: Increased strength;Both right and left upper extremity;With theraband;With Supervision  OT Frequency: Min 2X/week   Barriers to D/C:    none noted       Co-evaluation              AM-PAC OT "6 Clicks" Daily Activity     Outcome Measure Help from another person eating meals?: None Help from another person taking care of personal grooming?: A Little Help from another person toileting, which includes using toliet, bedpan, or urinal?: A Lot Help from another person bathing (including washing, rinsing, drying)?: A Lot Help  from another person to put on and taking off regular upper body clothing?: A Little Help from another person to put on and taking off regular lower body clothing?: A Lot 6 Click Score: 16   End of Session Equipment Utilized During Treatment: Gait belt;Rolling walker  Activity Tolerance: Patient tolerated treatment well Patient left: in chair;with call bell/phone within reach;with chair alarm set;with family/visitor present  OT Visit Diagnosis: Unsteadiness on feet (R26.81);Muscle weakness (generalized) (M62.81);Other symptoms and signs involving cognitive function;Pain;Dizziness and giddiness (R42)                Time: [id number] OT Time Calculation (min): 18 min Charges:  OT General Charges $OT Visit: 1 Visit OT Evaluation $OT Eval Moderate Complexity: 1 Mod  03/19/2021  Rich, OTR/L  Acute Rehabilitation Services  Office:  [phone]   Metta Clines 03/19/2021, 4:26 PM

## 2021-03-19 NOTE — Progress Notes (Signed)
PROGRESS NOTE    Leslie [name]  [id number] DOB: 1941/08/18 DOA: 03/16/2021 PCP: Wallene Dales, MD   Brief Narrative:  Leslie [name] is a 80 y.o. male with medical history significant for HTN, DMT2, Dementia, CAD who presented from home by EMS for altered mental status.  Reportedly for the last week he has not been eating well and he has not had much to drink.  Upon arrival, he was somnolent, a code stroke was called but work-up was negative.  He was seen by neurology who did not feel the need to be any further work-up.  He was found to have acute renal failure on labs which is suspected be the cause of his altered mental status.  No history of fever or chills.  Wife reports that yesterday he did sit outside on her back deck in the sun for a few hours and was not drinking much.    Subjective:  Patient in bed, appears comfortable, denies any headache, no fever, no chest pain or pressure, no shortness of breath , no abdominal pain. No new focal weakness.    Assessment & Plan:    Acute metabolic encephalopathy: CT head, MRI brain, MRA head and neck all negative for stroke or any other pathology.  Doppler carotid negative for significant stenosis and transthoracic echo with normal ejection fraction and no diastolic dysfunction.  Likely due to dehydration and AKI improved after IV fluids continue supportive care.  Acute kidney injury/dehydration: Due to dehydration however renal ultrasound did show mild left-sided hydronephrosis, has been aggressively hydrated with IV fluids and now has a Foley catheter along with Flomax.  Renal function improving.  Continue to monitor  Essential hypertension: Controlled.  Continue to hold lisinopril but continue as needed hydralazine.  Mild acute hypovolemic hyponatremia: Sodium improving with IVF.  Advanced dementia: Continue Aricept but neurology recommended discontinuing memantine.  CAD/hyperlipidemia: Asymptomatic, continue home medications of aspirin,  Plavix and statin.  Asymptomatic bacteriuria: UA was checked last night has positive leukocytes but no nitrites and has some rare bacteria. Monitor.     Hypokalemia.  Replaced.    Type 2 diabetes mellitus: Poor outpatient control due to hyperglycemia A1c 10.8, currently on home dose NovoLog Mix 70/30 20-25 units, along with SSI, will add SSI upon discharge. DM education.  Lab Results  Component Value Date   HGBA1C 10.8 (H) 03/17/2021   CBG (last 3)  Recent Labs    03/18/21 1759 03/19/21 0016 03/19/21 0556  GLUCAP 194* 106* 93       DVT prophylaxis: heparin injection 5,000 Units Start: 03/17/21 1400   Code Status: Full Code  Family Communication: Son bedside 03/19/21  Status is: Inpatient  Remains inpatient appropriate because:Inpatient level of care appropriate due to severity of illness  Dispo: The patient is from: Home              Anticipated d/c is to: SNF              Patient currently is not medically stable to d/c.   Difficult to place patient No   Estimated body mass index is 27.9 kg/m as calculated from the following:   Height as of this encounter: '5\' 9"'$  (1.753 m).   Weight as of 09/01/15: 85.7 kg.   Consultants:  None  Procedures:  None  Antimicrobials:  Anti-infectives (From admission, onward)    None         Objective: Vitals:   03/18/21 2056 03/19/21 0020 03/19/21 0557 03/19/21 0737  BP: 132/67  118/70 (!) 151/75 140/73  Pulse: 87 72 85 88  Resp: '17 18 17 17  '$ Temp: 98.6 F (37 C) 98.8 F (37.1 C) 99 F (37.2 C) 98.1 F (36.7 C)  TempSrc: Oral Oral Oral Oral  SpO2: 100% 100% 99% 99%  Height:        Intake/Output Summary (Last 24 hours) at 03/19/2021 1118 Last data filed at 03/19/2021 0915 Gross per 24 hour  Intake 2940.5 ml  Output 1625 ml  Net 1315.5 ml   There were no vitals filed for this visit.  Examination:  Awake Alert x 2 No new F.N deficits, foley in place [place].AT,PERRAL Supple Neck,No JVD, No cervical  lymphadenopathy appriciated.  Symmetrical Chest wall movement, Good air movement bilaterally, CTAB RRR,No Gallops, Rubs or new Murmurs, No Parasternal Heave +ve B.Sounds, Abd Soft, No tenderness, No organomegaly appriciated, No rebound - guarding or rigidity. No Cyanosis, Clubbing or edema, No new Rash or bruise   Data Reviewed: I have personally reviewed following labs and imaging studies  Recent Labs  Lab 03/16/21 1839 03/17/21 0433 03/18/21 0431 03/19/21 0224  WBC 9.3 7.9 7.5 5.2  HGB 11.9*  11.6* 11.3* 11.1* 9.9*  HCT 33.3*  34.0* 32.1* 30.6* 27.9*  PLT 126* 131* 136* 129*  MCV 96.5 96.7 95.3 96.2  MCH 34.5* 34.0 34.6* 34.1*  MCHC 35.7 35.2 36.3* 35.5  RDW 12.6 12.5 12.3 12.4  LYMPHSABS 0.3*  --  0.3* 0.6*  MONOABS 0.7  --  0.7 0.6  EOSABS 0.0  --  0.0 0.2  BASOSABS 0.0  --  0.0 0.0    Recent Labs  Lab 03/16/21 1839 03/17/21 0433 03/17/21 1825 03/18/21 0431 03/19/21 0224  NA 131*  132* 132*  --  134* 137  K 4.0  4.1 3.9  --  3.6 3.2*  CL 102  102 103  --  103 108  CO2 18* 20*  --  21* 22  GLUCOSE 359*  358* 301*  --  234* 110*  BUN 47*  41* 45*  --  41* 36*  CREATININE 3.20*  3.20* 2.86*  --  2.53* 2.22*  CALCIUM 8.8* 8.7*  --  8.5* 8.2*  AST 17  --   --   --   --   ALT 18  --   --   --   --   ALKPHOS 54  --   --   --   --   BILITOT 1.1  --   --   --   --   ALBUMIN 3.2*  --   --   --   --   MG  --   --  2.0  --   --   INR 1.2  --   --   --   --   HGBA1C  --  10.8*  --   --   --         Radiology Studies: No results found.  Scheduled Meds:  aspirin EC  81 mg Oral Daily   atorvastatin  40 mg Oral QPM   Chlorhexidine Gluconate Cloth  6 each Topical Daily   clopidogrel  75 mg Oral Daily   donepezil  10 mg Oral QHS   finasteride  5 mg Oral Daily   heparin  5,000 Units Subcutaneous Q8H   insulin aspart protamine- aspart  20 Units Subcutaneous BID WC   potassium chloride  20 mEq Oral Once   tamsulosin  0.4 mg Oral Daily   Continuous  Infusions:  LOS: 3 days   Time spent: 30 minutes  Signature  Lala Lund M.D on 03/19/2021 at 11:18 AM   -  To page go to [email or web address]

## 2021-03-19 NOTE — Progress Notes (Signed)
Inpatient Diabetes Program Recommendations  AACE/ADA: New Consensus Statement on Inpatient Glycemic Control (2015)  Target Ranges:  Prepandial:   less than 140 mg/dL      Peak postprandial:   less than 180 mg/dL (1-2 hours)      Critically ill patients:  140 - 180 mg/dL   Lab Results  Component Value Date   GLUCAP 124 (H) 03/19/2021   HGBA1C 10.8 (H) 03/17/2021    Referral received.  Diabetes coordinator spoke with patient and daughter on 03/18/21.  Please page if there are further needs with this patient.  Will follow.    Thanks,  Adah Perl, RN, BC-ADM Inpatient Diabetes Coordinator Pager [phone]  (8a-5p)

## 2021-03-19 NOTE — Discharge Instructions (Addendum)
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  Follow with Primary MD Asres, Alehegn, MD in 7 days   Get CBC, CMP, 2 view Chest X ray -  checked next visit within 1 week by Primary MD or SNF MD   Activity: As tolerated with Full fall precautions use walker/cane & assistance as needed  Disposition SNF  Diet: Heart Healthy Low Carb, check CBGs QAC-HS  Special Instructions: If you have smoked or chewed Tobacco  in the last 2 yrs please stop smoking, stop any regular Alcohol  and or any Recreational drug use.  On your next visit with your primary care physician please Get Medicines reviewed and adjusted.  Please request your Prim.MD to go over all Hospital Tests and Procedure/Radiological results at the follow up, please get all Hospital records sent to your Prim MD by signing hospital release before you go home.  If you experience worsening of your admission symptoms, develop shortness of breath, life threatening emergency, suicidal or homicidal thoughts you must seek medical attention immediately by calling 911 or calling your MD immediately  if symptoms less severe.  You Must read complete instructions/literature along with all the possible adverse reactions/side effects for all the Medicines you take and that have been prescribed to you. Take any new Medicines after you have completely understood and accpet all the possible adverse reactions/side effects.

## 2021-03-20 DIAGNOSIS — N179 Acute kidney failure, unspecified: Secondary | ICD-10-CM | POA: Diagnosis not present

## 2021-03-20 LAB — CBC WITH DIFFERENTIAL/PLATELET
Abs Immature Granulocytes: 0.08 10*3/uL — ABNORMAL HIGH (ref 0.00–0.07)
Basophils Absolute: 0 10*3/uL (ref 0.0–0.1)
Basophils Relative: 1 %
Eosinophils Absolute: 0.1 10*3/uL (ref 0.0–0.5)
Eosinophils Relative: 2 %
HCT: 29.8 % — ABNORMAL LOW (ref 39.0–52.0)
Hemoglobin: 10.8 g/dL — ABNORMAL LOW (ref 13.0–17.0)
Immature Granulocytes: 1 %
Lymphocytes Relative: 8 %
Lymphs Abs: 0.5 10*3/uL — ABNORMAL LOW (ref 0.7–4.0)
MCH: 34.5 pg — ABNORMAL HIGH (ref 26.0–34.0)
MCHC: 36.2 g/dL — ABNORMAL HIGH (ref 30.0–36.0)
MCV: 95.2 fL (ref 80.0–100.0)
Monocytes Absolute: 0.5 10*3/uL (ref 0.1–1.0)
Monocytes Relative: 9 %
Neutro Abs: 4.7 10*3/uL (ref 1.7–7.7)
Neutrophils Relative %: 79 %
Platelets: 144 10*3/uL — ABNORMAL LOW (ref 150–400)
RBC: 3.13 MIL/uL — ABNORMAL LOW (ref 4.22–5.81)
RDW: 12.6 % (ref 11.5–15.5)
WBC: 6 10*3/uL (ref 4.0–10.5)
nRBC: 0.3 % — ABNORMAL HIGH (ref 0.0–0.2)

## 2021-03-20 LAB — COMPREHENSIVE METABOLIC PANEL
ALT: 27 U/L (ref 0–44)
AST: 31 U/L (ref 15–41)
Albumin: 2.2 g/dL — ABNORMAL LOW (ref 3.5–5.0)
Alkaline Phosphatase: 45 U/L (ref 38–126)
Anion gap: 8 (ref 5–15)
BUN: 32 mg/dL — ABNORMAL HIGH (ref 8–23)
CO2: 23 mmol/L (ref 22–32)
Calcium: 8.5 mg/dL — ABNORMAL LOW (ref 8.9–10.3)
Chloride: 109 mmol/L (ref 98–111)
Creatinine, Ser: 2.12 mg/dL — ABNORMAL HIGH (ref 0.61–1.24)
GFR, Estimated: 31 mL/min — ABNORMAL LOW (ref >60)
Glucose, Bld: 107 mg/dL — ABNORMAL HIGH (ref 70–99)
Potassium: 4.1 mmol/L (ref 3.5–5.1)
Sodium: 140 mmol/L (ref 135–145)
Total Bilirubin: 0.6 mg/dL (ref 0.3–1.2)
Total Protein: 5.2 g/dL — ABNORMAL LOW (ref 6.5–8.1)

## 2021-03-20 LAB — GLUCOSE, CAPILLARY
Glucose-Capillary: 107 mg/dL — ABNORMAL HIGH (ref 70–99)
Glucose-Capillary: 111 mg/dL — ABNORMAL HIGH (ref 70–99)
Glucose-Capillary: 113 mg/dL — ABNORMAL HIGH (ref 70–99)
Glucose-Capillary: 151 mg/dL — ABNORMAL HIGH (ref 70–99)
Glucose-Capillary: 165 mg/dL — ABNORMAL HIGH (ref 70–99)
Glucose-Capillary: 221 mg/dL — ABNORMAL HIGH (ref 70–99)
Glucose-Capillary: 89 mg/dL (ref 70–99)

## 2021-03-20 LAB — PHOSPHORUS: Phosphorus: 2.4 mg/dL — ABNORMAL LOW (ref 2.5–4.6)

## 2021-03-20 LAB — MAGNESIUM: Magnesium: 1.9 mg/dL (ref 1.7–2.4)

## 2021-03-20 MED ORDER — BOOST PLUS PO LIQD
237.0000 mL | Freq: Three times a day (TID) | ORAL | Status: DC
  Administered 2021-03-20 – 2021-03-24 (×11): 237 mL via ORAL
  Filled 2021-03-20 (×14): qty 237

## 2021-03-20 NOTE — Progress Notes (Signed)
PROGRESS NOTE    Leslie [name]  [id number] DOB: 1940/10/03 DOA: 03/16/2021 PCP: Wallene Dales, MD   Brief Narrative:  Leslie [name] is a 80 y.o. male with medical history significant for HTN, DMT2, Dementia, CAD who presented from home by EMS for altered mental status.  Reportedly for the last week he has not been eating well and he has not had much to drink.  Upon arrival, he was somnolent, a code stroke was called but work-up was negative.  He was seen by neurology who did not feel the need to be any further work-up.  He was found to have acute renal failure on labs which is suspected be the cause of his altered mental status.  No history of fever or chills.  Wife reports that yesterday he did sit outside on her back deck in the sun for a few hours and was not drinking much.    Subjective:  Patient in bed, appears comfortable, denies any headache, no fever, no chest pain or pressure, no shortness of breath , no abdominal pain. No new focal weakness, he appears slightly more confused than yesterday.     Assessment & Plan:    Acute metabolic encephalopathy: CT head, MRI brain, MRA head and neck all negative for stroke or any other pathology.  Doppler carotid negative for significant stenosis and transthoracic echo with normal ejection fraction and no diastolic dysfunction.  Likely due to dehydration and AKI improved after IV fluids continue supportive care.  Acute kidney injury/dehydration: Due to dehydration however renal ultrasound did show mild left-sided hydronephrosis, has been aggressively hydrated with IV fluids and now has a Foley catheter along with Flomax.  Renal function improving.  Continue to monitor  Essential hypertension: Controlled.  Continue to hold lisinopril but continue as needed hydralazine.  Mild acute hypovolemic hyponatremia: Sodium improving with IVF.  Advanced dementia: Continue Aricept but neurology recommended discontinuing memantine.  Will require  SNF.  CAD/hyperlipidemia: Asymptomatic, continue home medications of aspirin, Plavix and statin.  Asymptomatic bacteriuria: UA was checked last night has positive leukocytes but no nitrites and has some rare bacteria. Monitor.     Hypokalemia.  Replaced.    Type 2 diabetes mellitus: Poor outpatient control due to hyperglycemia A1c 10.8, currently on home dose NovoLog Mix 70/30 20-25 units, along with SSI, will add SSI upon discharge. DM education.  Lab Results  Component Value Date   HGBA1C 10.8 (H) 03/17/2021   CBG (last 3)  Recent Labs    03/20/21 0044 03/20/21 0538 03/20/21 0947  GLUCAP 111* 89 113*       DVT prophylaxis: heparin injection 5,000 Units Start: 03/17/21 1400   Code Status: Full Code  Family Communication: Son bedside 03/19/21  Status is: Inpatient  Remains inpatient appropriate because:Inpatient level of care appropriate due to severity of illness  Dispo: The patient is from: Home              Anticipated d/c is to: SNF              Patient currently is not medically stable to d/c.   Difficult to place patient No   Estimated body mass index is 27.9 kg/m as calculated from the following:   Height as of this encounter: '5\' 9"'$  (1.753 m).   Weight as of 09/01/15: 85.7 kg.   Consultants:  None  Procedures:  None  Antimicrobials:  Anti-infectives (From admission, onward)    None         Objective: Vitals:  03/19/21 2020 03/20/21 0059 03/20/21 0410 03/20/21 0803  BP: (!) 153/87 (!) 148/78 138/78 (!) 154/77  Pulse: 93 85 82 87  Resp: '17 16 18 17  '$ Temp: (!) 97.4 F (36.3 C) 98.2 F (36.8 C) 98.8 F (37.1 C) 97.7 F (36.5 C)  TempSrc: Oral Oral Oral Oral  SpO2: 100% 99% 99% 100%  Height:        Intake/Output Summary (Last 24 hours) at 03/20/2021 0953 Last data filed at 03/20/2021 0759 Gross per 24 hour  Intake 700 ml  Output 1375 ml  Net -675 ml   There were no vitals filed for this visit.  Examination:  Awake Alert x 1 No new  F.N deficits, foley in place [place].AT,PERRAL Supple Neck,No JVD, No cervical lymphadenopathy appriciated.  Symmetrical Chest wall movement, Good air movement bilaterally, CTAB RRR,No Gallops, Rubs or new Murmurs, No Parasternal Heave +ve B.Sounds, Abd Soft, No tenderness, No organomegaly appriciated, No rebound - guarding or rigidity. No Cyanosis, Clubbing or edema, No new Rash or bruise    Data Reviewed: I have personally reviewed following labs and imaging studies  Recent Labs  Lab 03/16/21 1839 03/17/21 0433 03/18/21 0431 03/19/21 0224 03/20/21 0715  WBC 9.3 7.9 7.5 5.2 6.0  HGB 11.9*  11.6* 11.3* 11.1* 9.9* 10.8*  HCT 33.3*  34.0* 32.1* 30.6* 27.9* 29.8*  PLT 126* 131* 136* 129* 144*  MCV 96.5 96.7 95.3 96.2 95.2  MCH 34.5* 34.0 34.6* 34.1* 34.5*  MCHC 35.7 35.2 36.3* 35.5 36.2*  RDW 12.6 12.5 12.3 12.4 12.6  LYMPHSABS 0.3*  --  0.3* 0.6* 0.5*  MONOABS 0.7  --  0.7 0.6 0.5  EOSABS 0.0  --  0.0 0.2 0.1  BASOSABS 0.0  --  0.0 0.0 0.0    Recent Labs  Lab 03/16/21 1839 03/17/21 0433 03/17/21 1825 03/18/21 0431 03/19/21 0224 03/20/21 0715  NA 131*  132* 132*  --  134* 137 140  K 4.0  4.1 3.9  --  3.6 3.2* 4.1  CL 102  102 103  --  103 108 109  CO2 18* 20*  --  21* 22 23  GLUCOSE 359*  358* 301*  --  234* 110* 107*  BUN 47*  41* 45*  --  41* 36* 32*  CREATININE 3.20*  3.20* 2.86*  --  2.53* 2.22* 2.12*  CALCIUM 8.8* 8.7*  --  8.5* 8.2* 8.5*  AST 17  --   --   --   --  31  ALT 18  --   --   --   --  27  ALKPHOS 54  --   --   --   --  45  BILITOT 1.1  --   --   --   --  0.6  ALBUMIN 3.2*  --   --   --   --  2.2*  MG  --   --  2.0  --   --  1.9  INR 1.2  --   --   --   --   --   HGBA1C  --  10.8*  --   --   --   --         Radiology Studies: No results found.  Scheduled Meds:  aspirin EC  81 mg Oral Daily   atorvastatin  40 mg Oral QPM   Chlorhexidine Gluconate Cloth  6 each Topical Daily   clopidogrel  75 mg Oral Daily   donepezil  10 mg Oral  QHS    finasteride  5 mg Oral Daily   heparin  5,000 Units Subcutaneous Q8H   insulin aspart protamine- aspart  20 Units Subcutaneous BID WC   tamsulosin  0.4 mg Oral Daily   Continuous Infusions:     LOS: 4 days   Time spent: 30 minutes  Signature  Lala Lund M.D on 03/20/2021 at 9:53 AM   -  To page go to [email or web address]

## 2021-03-20 NOTE — Plan of Care (Addendum)
  Problem: Education: Goal: Knowledge of General Education information will improve Description: Including pain rating scale, medication(s)/side effects and non-pharmacologic comfort measures 03/20/2021 1943 by Derrill Kay, RN Outcome: Progressing 03/20/2021 1940 by Derrill Kay, RN Outcome: Progressing 03/20/2021 1936 by Derrill Kay, RN Outcome: Progressing   Problem: Health Behavior/Discharge Planning: Goal: Ability to manage health-related needs will improve 03/20/2021 1943 by Derrill Kay, RN Outcome: Progressing 03/20/2021 1940 by Derrill Kay, RN Outcome: Progressing 03/20/2021 1936 by Derrill Kay, RN Outcome: Progressing   Goal: Will remain free from infection 03/20/2021 1943 by Derrill Kay, RN Outcome: Progressing 03/20/2021 1940 by Derrill Kay, RN Outcome: Progressing 03/20/2021 1936 by Derrill Kay, RN Outcome: Progressing  Problem: Activity: Goal: Risk for activity intolerance will decrease 03/20/2021 1943 by Derrill Kay, RN Outcome: Progressing 03/20/2021 1940 by Derrill Kay, RN Outcome: Progressing 03/20/2021 1936 by Derrill Kay, RN Outcome: Progressing   Problem: Nutrition: Goal: Adequate nutrition will be maintained 03/20/2021 1943 by Derrill Kay, RN Outcome: Progressing 03/20/2021 1940 by Derrill Kay, RN Outcome: Progressing 03/20/2021 1936 by Derrill Kay, RN Outcome: Progressing   Problem: Elimination: Goal: Will not experience complications related to bowel motility 03/20/2021 1943 by Derrill Kay, RN Outcome: Progressing 03/20/2021 1940 by Derrill Kay, RN Outcome: Progressing 03/20/2021 1936 by Derrill Kay, RN Outcome: Progressing   Problem: Pain Managment: Goal: General experience of comfort will improve 03/20/2021 1943 by Derrill Kay,  RN Outcome: Progressing 03/20/2021 1940 by Derrill Kay, RN Outcome: Progressing 03/20/2021 1936 by Derrill Kay, RN Outcome: Progressing   Problem: Safety: Goal: Ability to remain free from injury will improve 03/20/2021 1943 by Derrill Kay, RN Outcome: Progressing 03/20/2021 1940 by Derrill Kay, RN Outcome: Progressing 03/20/2021 1936 by Derrill Kay, RN Outcome: Progressing   Problem: Skin Integrity: Goal: Risk for impaired skin integrity will decrease 03/20/2021 1943 by Derrill Kay, RN Outcome: Progressing 03/20/2021 1940 by Derrill Kay, RN Outcome: Progressing 03/20/2021 1936 by Derrill Kay, RN Outcome: Progressing

## 2021-03-21 DIAGNOSIS — N179 Acute kidney failure, unspecified: Secondary | ICD-10-CM | POA: Diagnosis not present

## 2021-03-21 LAB — GLUCOSE, CAPILLARY
Glucose-Capillary: 130 mg/dL — ABNORMAL HIGH (ref 70–99)
Glucose-Capillary: 215 mg/dL — ABNORMAL HIGH (ref 70–99)
Glucose-Capillary: 208 mg/dL — ABNORMAL HIGH (ref 70–99)
Glucose-Capillary: 181 mg/dL — ABNORMAL HIGH (ref 70–99)

## 2021-03-21 NOTE — TOC Initial Note (Signed)
Transition of Care [facility]) - Initial/Assessment Note    Patient Details  Name: Leslie [name] MRN: [id number] Date of Birth: 04-09-1941  [facility] CM/SW Contact:    Emeterio Reeve, LCSW Phone Number: 03/21/2021, 1:15 PM  Clinical Narrative:                  CSW received SNF consult. CSW met with pt wife, Butch Penny at bedside. Pt is oriented to self only and was sleeping. CSW introduced self and explained role at the hospital. Butch Penny reports that PTA pt lived at home with her. Butch Penny reports pt used a walker sometimes but was independent with mobility and ADL's.   CSW reviewed PT/OT recommendations for SNF. Butch Penny reports pt needs rehab and would like for hi to go to [facility] [employer], [employer]/film/video) for SNF. Pt gave CSW permission to fax out to facilities in the area as back up. CSW gave pt medicare.gov rating list to review. CSW explained insurance auth process. Pt reports they are covid vaccinated with booster.  CSW will continue to follow.  Expected Discharge Plan: Skilled Nursing Facility Barriers to Discharge: Continued Medical Work up, Ship broker   Patient Goals and CMS Choice Patient states their goals for this hospitalization and ongoing recovery are:: to get better CMS Medicare.gov Compare Post Acute Care list provided to:: Patient Choice offered to / list presented to : Spouse  Expected Discharge Plan and Services Expected Discharge Plan: [place] arrangements for the past 2 months: Single Family Home                                      Prior Living Arrangements/Services Living arrangements for the past 2 months: Single Family Home Lives with:: Spouse Patient language and need for interpreter reviewed:: Yes Do you feel safe going back to the place where you live?: Yes      Need for Family Participation in Patient Care: Yes (Comment) Care giver support system in place?: Yes (comment)   Criminal Activity/Legal  Involvement Pertinent to Current Situation/Hospitalization: No - Comment as needed  Activities of Daily Living      Permission Sought/Granted Permission sought to share information with : Family Supports Permission granted to share information with : Yes, Verbal Permission Granted     Permission granted to share info w AGENCY: SNF        Emotional Assessment Appearance:: Appears stated age Attitude/Demeanor/Rapport: Other (comment) (Sleeping) Affect (typically observed): Other (comment) (Sleeping) Orientation: : Oriented to Self Alcohol / Substance Use: Not Applicable Psych Involvement: No (comment)  Admission diagnosis:  Confusion [R41.0] ARF (acute renal failure) ([facility]) [N17.9] AKI (acute kidney injury) ([place]) [N17.9] Patient Active Problem List   Diagnosis Date Noted   ARF (acute renal failure) ([place]) 03/16/2021   Diabetes mellitus type 2, uncontrolled ([place]) 03/16/2021   CAD (coronary artery disease) 03/16/2021   Dementia ([place]) 03/16/2021   Pain in the chest 09/01/2015   Chest pain 09/01/2015   Hypertension 09/01/2015   Diabetes mellitus with complication ([place]) 84/53/6468   HLD (hyperlipidemia) 09/01/2015   Chest pain, unspecified    Coronary artery disease involving native coronary artery of native heart with angina pectoris ([place])    PCP:  Wallene Dales, MD Pharmacy:   [id number] [place], [place] - [address] AT ACROSS FROM [address] [place]  [place] Phone: [phone] Fax: [phone]     Social Determinants of Health (SDOH) Interventions    Readmission Risk Interventions No flowsheet data found.  Emeterio Reeve, Latanya Presser, [place] Social Worker [phone]

## 2021-03-21 NOTE — Plan of Care (Signed)

## 2021-03-21 NOTE — NC FL2 (Signed)
[place] LEVEL OF CARE SCREENING TOOL     IDENTIFICATION  Patient Name: Leslie [name] Birthdate: June 21, 1941 Sex: male Admission Date (Current Location): 03/16/2021  [facility] and [place] Number:  Herbalist and Address:  [facility] [place], [place] [address], [place], [place] [address]      Provider Number: [id number]  Attending Physician Name and Address:  Thurnell Lose, MD  Relative Name and Phone Number:       Current Level of Care: Hospital Recommended Level of Care: [place] Prior Approval Number:    Date Approved/Denied:   PASRR Number: pending  Discharge Plan: SNF    Current Diagnoses: Patient Active Problem List   Diagnosis Date Noted   ARF (acute renal failure) ([place]) 03/16/2021   Diabetes mellitus type 2, uncontrolled ([place]) 03/16/2021   CAD (coronary artery disease) 03/16/2021   Dementia ([place]) 03/16/2021   Pain in the chest 09/01/2015   Chest pain 09/01/2015   Hypertension 09/01/2015   Diabetes mellitus with complication ([place]) [id number]   HLD (hyperlipidemia) 09/01/2015   Chest pain, unspecified    Coronary artery disease involving native coronary artery of native heart with angina pectoris (HCC)     Orientation RESPIRATION BLADDER Height & Weight     Self  Normal Incontinent Weight:   Height:  '5\' 9"'$  (175.3 cm)  BEHAVIORAL SYMPTOMS/MOOD NEUROLOGICAL BOWEL NUTRITION STATUS      Incontinent Diet (See DC summary)  AMBULATORY STATUS COMMUNICATION OF NEEDS Skin   Extensive Assist Verbally                         Personal Care Assistance Level of Assistance  Feeding, Bathing, Dressing Bathing Assistance: Maximum assistance Feeding assistance: Limited assistance Dressing Assistance: Maximum assistance     Functional Limitations Info  Sight, Speech, Hearing Sight Info: Adequate Hearing Info: Adequate Speech Info: Adequate    SPECIAL CARE FACTORS FREQUENCY  OT (By licensed OT), PT (By  licensed PT)     PT Frequency: 5x a week OT Frequency: 5x a week            Contractures Contractures Info: Not present    Additional Factors Info  Code Status, Allergies Code Status Info: [place] Allergies Info: NKA           Current Medications (03/21/2021):  This is the current hospital active medication list Current Facility-Administered Medications  Medication Dose Route Frequency Provider Last Rate Last Admin   acetaminophen (TYLENOL) tablet 650 mg  650 mg Oral Q6H PRN Chotiner, Yevonne Aline, MD   650 mg at 03/18/21 0947   aspirin EC tablet 81 mg  81 mg Oral Daily Chotiner, Yevonne Aline, MD   81 mg at 03/21/21 0854   atorvastatin (LIPITOR) tablet 40 mg  40 mg Oral QPM Chotiner, Yevonne Aline, MD   40 mg at 03/20/21 1807   Chlorhexidine Gluconate Cloth 2 % PADS 6 each  6 each Topical Daily Darliss Cheney, MD   6 each at 03/20/21 0953   clopidogrel (PLAVIX) tablet 75 mg  75 mg Oral Daily Chotiner, Yevonne Aline, MD   75 mg at 03/21/21 0854   donepezil (ARICEPT) tablet 10 mg  10 mg Oral QHS Chotiner, Yevonne Aline, MD   10 mg at 03/20/21 2004   finasteride (PROSCAR) tablet 5 mg  5 mg Oral Daily Chotiner, Yevonne Aline, MD   5 mg at 03/21/21 0854   heparin injection 5,000 Units  5,000 Units Subcutaneous Q8H Chotiner, Yevonne Aline, MD   5,000 Units at 03/21/21 1537   hydrALAZINE (APRESOLINE) injection 10 mg  10 mg Intravenous Q6H PRN Darliss Cheney, MD   10 mg at 03/17/21 1714   insulin aspart (novoLOG) injection 0-9 Units  0-9 Units Subcutaneous Q6H PRN Chotiner, Yevonne Aline, MD   3 Units at 03/21/21 1536   insulin aspart protamine- aspart (NOVOLOG MIX 70/30) injection 20 Units  20 Units Subcutaneous BID WC Darliss Cheney, MD   20 Units at 03/21/21 0853   labetalol (NORMODYNE) injection 10 mg  10 mg Intravenous Q2H PRN Darliss Cheney, MD       lactose free nutrition (BOOST PLUS) liquid 237 mL  237 mL Oral TID WC Thurnell Lose, MD   237 mL at 03/21/21 1150   nitroGLYCERIN (NITROSTAT) SL tablet 0.4 mg  0.4 mg  Sublingual Q5 min PRN Darliss Cheney, MD       ondansetron (ZOFRAN) injection 4 mg  4 mg Intravenous Q6H PRN Chotiner, Yevonne Aline, MD   4 mg at 03/20/21 1815   tamsulosin (FLOMAX) capsule 0.4 mg  0.4 mg Oral Daily Thurnell Lose, MD   0.4 mg at 03/21/21 [id number]     Discharge Medications: Please see discharge summary for a list of discharge medications.  Relevant Imaging Results:  Relevant Lab Results:   Additional Information SSN: [id number] covid vaccinated 1 booster  Emeterio Reeve, LCSW

## 2021-03-21 NOTE — Progress Notes (Signed)
PROGRESS NOTE    Leslie [name]  [id number] DOB: Jan 19, 1941 DOA: 03/16/2021 PCP: Wallene Dales, MD   Brief Narrative:  Leslie [name] is a 80 y.o. male with medical history significant for HTN, DMT2, Dementia, CAD who presented from home by EMS for altered mental status.  Reportedly for the last week he has not been eating well and he has not had much to drink.  Upon arrival, he was somnolent, a code stroke was called but work-up was negative.  He was seen by neurology who did not feel the need to be any further work-up.  He was found to have acute renal failure on labs which is suspected be the cause of his altered mental status.  No history of fever or chills.  Wife reports that yesterday he did sit outside on her back deck in the sun for a few hours and was not drinking much.    Subjective:  Patient in bed, lightly sleepy but easily arousable, after few prompts answered all questions and follow basic commands appropriately, appears comfortable, denies any headache, no fever, no chest pain or pressure, no shortness of breath , no abdominal pain. No new focal weakness.   He is medically stable we await placement.    Assessment & Plan:    Acute metabolic encephalopathy: CT head, MRI brain, MRA head and neck all negative for stroke or any other pathology.  Doppler carotid negative for significant stenosis and transthoracic echo with normal ejection fraction and no diastolic dysfunction.  Likely due to dehydration and AKI improved after IV fluids continue supportive care.  Acute kidney injury/dehydration: Due to dehydration however renal ultrasound did show mild left-sided hydronephrosis, has been aggressively hydrated with IV fluids and now has a Foley catheter along with Flomax.  Renal function improving.  Plan is to discharge him with Foley with outpatient urology follow-up.  Essential hypertension: Controlled.  Continue to hold lisinopril but continue as needed hydralazine.  Mild acute  hypovolemic hyponatremia: Sodium improving with IVF.  Advanced dementia: Continue Aricept but neurology recommended discontinuing memantine.  Will require SNF.  CAD/hyperlipidemia: Asymptomatic, continue home medications of aspirin, Plavix and statin.  Asymptomatic bacteriuria: UA was checked last night has positive leukocytes but no nitrites and has some rare bacteria. Monitor.     Hypokalemia.  Replaced.    Type 2 diabetes mellitus: Poor outpatient control due to hyperglycemia A1c 10.8, currently on home dose NovoLog Mix 70/30 20-25 units, along with SSI, will add SSI upon discharge. DM education.  Lab Results  Component Value Date   HGBA1C 10.8 (H) 03/17/2021   CBG (last 3)  Recent Labs    03/20/21 1724 03/20/21 2358 03/21/21 0556  GLUCAP 151* 107* 130*       DVT prophylaxis: heparin injection 5,000 Units Start: 03/17/21 1400   Code Status: Full Code  Family Communication: Son bedside 03/19/21  Status is: Inpatient  Remains inpatient appropriate because:Inpatient level of care appropriate due to severity of illness  Dispo: The patient is from: Home              Anticipated d/c is to: SNF              Patient currently is not medically stable to d/c.   Difficult to place patient No   Estimated body mass index is 27.9 kg/m as calculated from the following:   Height as of this encounter: '5\' 9"'$  (1.753 m).   Weight as of 09/01/15: 85.7 kg.   Consultants:  None  Procedures:  None  Antimicrobials:  Anti-infectives (From admission, onward)    None         Objective: Vitals:   03/20/21 1951 03/20/21 1951 03/21/21 0305 03/21/21 0749  BP: (!) 148/83 (!) 148/83 (!) 153/89 (!) 149/74  Pulse: (!) 110 (!) 110 (!) 108 96  Resp:  '17 17 17  '$ Temp: 97.8 F (36.6 C) 97.8 F (36.6 C) 99 F (37.2 C) 98 F (36.7 C)  TempSrc: Oral Oral Oral Oral  SpO2: 100% 100% 99% 99%  Height:        Intake/Output Summary (Last 24 hours) at 03/21/2021 1012 Last data filed at  03/20/2021 2209 Gross per 24 hour  Intake 510 ml  Output 1200 ml  Net -690 ml   There were no vitals filed for this visit.  Examination:  Awake Alert x 1 No new F.N deficits, Foley in place [place].AT,PERRAL Supple Neck,No JVD, No cervical lymphadenopathy appriciated.  Symmetrical Chest wall movement, Good air movement bilaterally, CTAB RRR,No Gallops, Rubs or new Murmurs, No Parasternal Heave +ve B.Sounds, Abd Soft, No tenderness, No organomegaly appriciated, No rebound - guarding or rigidity. No Cyanosis, Clubbing or edema, No new Rash or bruise     Data Reviewed: I have personally reviewed following labs and imaging studies  Recent Labs  Lab 03/16/21 1839 03/17/21 0433 03/18/21 0431 03/19/21 0224 03/20/21 0715  WBC 9.3 7.9 7.5 5.2 6.0  HGB 11.9*  11.6* 11.3* 11.1* 9.9* 10.8*  HCT 33.3*  34.0* 32.1* 30.6* 27.9* 29.8*  PLT 126* 131* 136* 129* 144*  MCV 96.5 96.7 95.3 96.2 95.2  MCH 34.5* 34.0 34.6* 34.1* 34.5*  MCHC 35.7 35.2 36.3* 35.5 36.2*  RDW 12.6 12.5 12.3 12.4 12.6  LYMPHSABS 0.3*  --  0.3* 0.6* 0.5*  MONOABS 0.7  --  0.7 0.6 0.5  EOSABS 0.0  --  0.0 0.2 0.1  BASOSABS 0.0  --  0.0 0.0 0.0    Recent Labs  Lab 03/16/21 1839 03/17/21 0433 03/17/21 1825 03/18/21 0431 03/19/21 0224 03/20/21 0715  NA 131*  132* 132*  --  134* 137 140  K 4.0  4.1 3.9  --  3.6 3.2* 4.1  CL 102  102 103  --  103 108 109  CO2 18* 20*  --  21* 22 23  GLUCOSE 359*  358* 301*  --  234* 110* 107*  BUN 47*  41* 45*  --  41* 36* 32*  CREATININE 3.20*  3.20* 2.86*  --  2.53* 2.22* 2.12*  CALCIUM 8.8* 8.7*  --  8.5* 8.2* 8.5*  AST 17  --   --   --   --  31  ALT 18  --   --   --   --  27  ALKPHOS 54  --   --   --   --  45  BILITOT 1.1  --   --   --   --  0.6  ALBUMIN 3.2*  --   --   --   --  2.2*  MG  --   --  2.0  --   --  1.9  INR 1.2  --   --   --   --   --   HGBA1C  --  10.8*  --   --   --   --         Radiology Studies: No results found.  Scheduled Meds:   aspirin EC  81 mg Oral Daily  atorvastatin  40 mg Oral QPM   Chlorhexidine Gluconate Cloth  6 each Topical Daily   clopidogrel  75 mg Oral Daily   donepezil  10 mg Oral QHS   finasteride  5 mg Oral Daily   heparin  5,000 Units Subcutaneous Q8H   insulin aspart protamine- aspart  20 Units Subcutaneous BID WC   lactose free nutrition  237 mL Oral TID WC   tamsulosin  0.4 mg Oral Daily   Continuous Infusions:     LOS: 5 days   Time spent: 30 minutes  Signature  Lala Lund M.D on 03/21/2021 at 10:12 AM   -  To page go to [email or web address]

## 2021-03-21 NOTE — Social Work (Cosign Needed Addendum)
Leslie [name] April 04, 1941   To whom it may concern:  Please be advised that the above named has a primary diagnosis of dementia which supercedes any psychiatric diagnosis.

## 2021-03-22 DIAGNOSIS — N179 Acute kidney failure, unspecified: Secondary | ICD-10-CM | POA: Diagnosis not present

## 2021-03-22 LAB — GLUCOSE, CAPILLARY
Glucose-Capillary: 142 mg/dL — ABNORMAL HIGH (ref 70–99)
Glucose-Capillary: 147 mg/dL — ABNORMAL HIGH (ref 70–99)
Glucose-Capillary: 158 mg/dL — ABNORMAL HIGH (ref 70–99)
Glucose-Capillary: 160 mg/dL — ABNORMAL HIGH (ref 70–99)
Glucose-Capillary: 212 mg/dL — ABNORMAL HIGH (ref 70–99)
Glucose-Capillary: 243 mg/dL — ABNORMAL HIGH (ref 70–99)
Glucose-Capillary: 252 mg/dL — ABNORMAL HIGH (ref 70–99)

## 2021-03-22 MED ORDER — MEGESTROL ACETATE 40 MG PO TABS
40.0000 mg | ORAL_TABLET | Freq: Every day | ORAL | Status: DC
  Administered 2021-03-22 – 2021-03-24 (×3): 40 mg via ORAL
  Filled 2021-03-22 (×3): qty 1

## 2021-03-22 NOTE — TOC Progression Note (Signed)
Transition of Care [facility]) - Progression Note    Patient Details  Name: Leslie [name] MRN: [id number] Date of Birth: 1940/09/25  Transition [facility] CM/SW Contact  Emeterio Reeve, [place] Phone Number: 03/22/2021, 4:27 PM  Clinical Narrative:     CSW met with pt and wife at bedside. CSW gave wife. Pts two bed offers. Wife stated that she would prefer [facility]. CSW explained that they do not accept pts insurance. Wife inquired about private pay. CSW instructed wife to call Admissions coordinator to discuss what private pay may look like.    CSW to follow.  Expected Discharge Plan: [place] Barriers to Discharge: Continued Medical Work up, Ship broker  Expected Discharge Plan and Services Expected Discharge Plan: [place] arrangements for the past 2 months: Single Family Home                                       Social Determinants of Health (SDOH) Interventions    Readmission Risk Interventions No flowsheet data found.  Emeterio Reeve, Latanya Presser, [place] Social Worker [phone]

## 2021-03-22 NOTE — Progress Notes (Signed)
Physical Therapy Treatment Patient Details Name: Leslie [name] MRN: [id number] DOB: 06/09/1941 Today's Date: 03/22/2021    History of Present Illness Pt is an 80 y.o. male admitted 7/27 with acute renal failure and AMS. PMH:  HTN, DMT2, Dementia, CAD    PT Comments     Supine in bottom of bed.  Pt required cues for encouragement as he presents with some baseline confusion.  Pt able to progress mobility this session to hall way distances.  He continues to require min to mod assistance.  Based on his progress will update recommendations to CIR.  Pt is an excellent candidate for aggressive short term rehab before returning home with his wife. Will inform supervising PT of need for change in recommendations at this time.    Follow Up Recommendations  Supervision/Assistance - 24 hour;CIR     Equipment Recommendations  Wheelchair (measurements PT)    Recommendations for Other Services       Precautions / Restrictions Precautions Precautions: Fall Restrictions Weight Bearing Restrictions: No    Mobility  Bed Mobility Overal bed mobility: Needs Assistance Bed Mobility: Supine to Sit;Sit to Supine     Supine to sit: Mod assist Sit to supine: Min assist   General bed mobility comments: Pt required assistance for trunk elevation.  Frequent cues to scoot to edge of bed to prepare for OOB transfer.    Transfers Overall transfer level: Needs assistance Equipment used: Rolling walker (2 wheeled) Transfers: Sit to/from Stand Sit to Stand: From elevated surface;Min guard         General transfer comment: increased time to scoot hips to the edge of the bed.  Min guard A to power up from elevated surface to improve ease.  Ambulation/Gait Ambulation/Gait assistance: Min assist Gait Distance (Feet): 45 Feet Assistive device: Rolling walker (2 wheeled) Gait Pattern/deviations: Trunk flexed;Shuffle;Step-through pattern     General Gait Details: Cues for upper trunk control. RW  safety and obstacle negotiation.   Stairs             Wheelchair Mobility    Modified Rankin (Stroke Patients Only)       Balance Overall balance assessment: Needs assistance Sitting-balance support: Feet supported;No upper extremity supported Sitting balance-Leahy Scale: Fair       Standing balance-Leahy Scale: Poor Standing balance comment: reliant on external support                            Cognition Arousal/Alertness: Awake/alert Behavior During Therapy: WFL for tasks assessed/performed Overall Cognitive Status: History of cognitive impairments - at baseline                                 General Comments: per family cognition is at baseline.      Exercises      General Comments        Pertinent Vitals/Pain Pain Assessment: Faces Faces Pain Scale: Hurts a little bit Pain Location: generalized discomfort. Pain Descriptors / Indicators: Grimacing;Discomfort Pain Intervention(s): Monitored during session;Repositioned    Home Living                      Prior Function            PT Goals (current goals can now be found in the care plan section) Acute Rehab PT Goals Potential to Achieve Goals: Fair Progress towards PT goals: Progressing toward  goals    Frequency    Min 3X/week      PT Plan Discharge plan needs to be updated;Frequency needs to be updated    Co-evaluation              AM-PAC PT "6 Clicks" Mobility   Outcome Measure  Help needed turning from your back to your side while in a flat bed without using bedrails?: A Little Help needed moving from lying on your back to sitting on the side of a flat bed without using bedrails?: A Lot Help needed moving to and from a bed to a chair (including a wheelchair)?: A Little Help needed standing up from a chair using your arms (e.g., wheelchair or bedside chair)?: A Little Help needed to walk in hospital room?: A Little Help needed climbing 3-5  steps with a railing? : A Little 6 Click Score: 17    End of Session Equipment Utilized During Treatment: Gait belt;Oxygen Activity Tolerance: Patient limited by fatigue Patient left: in bed;with call bell/phone within reach;with bed alarm set;with family/visitor present Nurse Communication: Mobility status PT Visit Diagnosis: Muscle weakness (generalized) (M62.81);Pain     Time: [id number] PT Time Calculation (min) (ACUTE ONLY): 15 min  Charges:  $Gait Training: 8-22 mins                     Erasmo Leventhal , PTA Acute Rehabilitation Services Pager [phone] Office [phone]    Leslie [name] 03/22/2021, 4:04 PM

## 2021-03-22 NOTE — Progress Notes (Signed)
PROGRESS NOTE    Leslie [name]  [id number] DOB: 06-05-1941 DOA: 03/16/2021 PCP: Wallene Dales, MD   Brief Narrative:  Leslie [name] is a 80 y.o. male with medical history significant for HTN, DMT2, Dementia, CAD who presented from home by EMS for altered mental status.  Reportedly for the last week he has not been eating well and he has not had much to drink.  Upon arrival, he was somnolent, a code stroke was called but work-up was negative.  He was seen by neurology who did not feel the need to be any further work-up.  He was found to have acute renal failure on labs which is suspected be the cause of his altered mental status.  No history of fever or chills.  Wife reports that yesterday he did sit outside on her back deck in the sun for a few hours and was not drinking much.    Subjective:  Patient in bed, appears comfortable but mildly confused, denies any headache, no fever, no chest pain or pressure, no shortness of breath , no abdominal pain. No new focal weakness.    He is medically stable we await placement.    Assessment & Plan:    Acute metabolic encephalopathy: CT head, MRI brain, MRA head and neck all negative for stroke or any other pathology.  Doppler carotid negative for significant stenosis and transthoracic echo with normal ejection fraction and no diastolic dysfunction.  Likely due to dehydration and AKI improved after IV fluids continue supportive care.  Acute kidney injury/dehydration: Due to dehydration however renal ultrasound did show mild left-sided hydronephrosis, has been aggressively hydrated with IV fluids and now has a Foley catheter along with Flomax.  Renal function improving.  Foley removed 03/21/21 - monitor.  Essential hypertension: Controlled.  Continue to hold lisinopril but continue as needed hydralazine.  Mild acute hypovolemic hyponatremia: Sodium improved with IVF.  Advanced dementia: Continue Aricept but neurology recommended discontinuing  memantine.  Will require SNF.  CAD/hyperlipidemia: Asymptomatic, continue home medications of aspirin, Plavix and statin.  Asymptomatic bacteriuria: UA was checked last night has positive leukocytes but no nitrites and has some rare bacteria. Monitor.     Hypokalemia.  Replaced.    Type 2 diabetes mellitus: Poor outpatient control due to hyperglycemia A1c 10.8, currently on home dose NovoLog Mix 70/30 20-25 units, along with SSI, will add SSI upon discharge. DM education.  Lab Results  Component Value Date   HGBA1C 10.8 (H) 03/17/2021   CBG (last 3)  Recent Labs    03/22/21 0557 03/22/21 0626 03/22/21 0758  GLUCAP 142* 160* 158*       DVT prophylaxis: heparin injection 5,000 Units Start: 03/17/21 1400   Code Status: Full Code  Family Communication: Son bedside 03/19/21, wife 03/21/21  Status is: Inpatient  Remains inpatient appropriate because:Inpatient level of care appropriate due to severity of illness  Dispo: The patient is from: Home              Anticipated d/c is to: SNF              Patient currently is not medically stable to d/c.   Difficult to place patient No   Estimated body mass index is 26.37 kg/m as calculated from the following:   Height as of this encounter: '5\' 9"'$  (1.753 m).   Weight as of this encounter: 81 kg.   Consultants:  None  Procedures:  None  Antimicrobials:  Anti-infectives (From admission, onward)    None  Objective: Vitals:   03/21/21 1630 03/21/21 2001 03/22/21 0018 03/22/21 0559  BP: (!) 141/77 (!) 151/79 (!) 103/53 120/67  Pulse: 96 97 94 (!) 104  Resp: '17 17 18 17  '$ Temp: 98.1 F (36.7 C) 97.8 F (36.6 C) 98.2 F (36.8 C) 97.8 F (36.6 C)  TempSrc: Oral Oral Oral   SpO2: 99% 99% 95% 99%  Weight:    81 kg  Height:        Intake/Output Summary (Last 24 hours) at 03/22/2021 1014 Last data filed at 03/22/2021 0806 Gross per 24 hour  Intake 240 ml  Output 125 ml  Net 115 ml   Filed Weights   03/22/21  0559  Weight: 81 kg    Examination:  Awake Alert x 1 No new F.N deficits,   [place].AT,PERRAL Supple Neck,No JVD, No cervical lymphadenopathy appriciated.  Symmetrical Chest wall movement, Good air movement bilaterally, CTAB RRR,No Gallops, Rubs or new Murmurs, No Parasternal Heave +ve B.Sounds, Abd Soft, No tenderness, No organomegaly appriciated, No rebound - guarding or rigidity. No Cyanosis, Clubbing or edema, No new Rash or bruise   Data Reviewed: I have personally reviewed following labs and imaging studies  Recent Labs  Lab 03/16/21 1839 03/17/21 0433 03/18/21 0431 03/19/21 0224 03/20/21 0715  WBC 9.3 7.9 7.5 5.2 6.0  HGB 11.9*  11.6* 11.3* 11.1* 9.9* 10.8*  HCT 33.3*  34.0* 32.1* 30.6* 27.9* 29.8*  PLT 126* 131* 136* 129* 144*  MCV 96.5 96.7 95.3 96.2 95.2  MCH 34.5* 34.0 34.6* 34.1* 34.5*  MCHC 35.7 35.2 36.3* 35.5 36.2*  RDW 12.6 12.5 12.3 12.4 12.6  LYMPHSABS 0.3*  --  0.3* 0.6* 0.5*  MONOABS 0.7  --  0.7 0.6 0.5  EOSABS 0.0  --  0.0 0.2 0.1  BASOSABS 0.0  --  0.0 0.0 0.0    Recent Labs  Lab 03/16/21 1839 03/17/21 0433 03/17/21 1825 03/18/21 0431 03/19/21 0224 03/20/21 0715  NA 131*  132* 132*  --  134* 137 140  K 4.0  4.1 3.9  --  3.6 3.2* 4.1  CL 102  102 103  --  103 108 109  CO2 18* 20*  --  21* 22 23  GLUCOSE 359*  358* 301*  --  234* 110* 107*  BUN 47*  41* 45*  --  41* 36* 32*  CREATININE 3.20*  3.20* 2.86*  --  2.53* 2.22* 2.12*  CALCIUM 8.8* 8.7*  --  8.5* 8.2* 8.5*  AST 17  --   --   --   --  31  ALT 18  --   --   --   --  27  ALKPHOS 54  --   --   --   --  45  BILITOT 1.1  --   --   --   --  0.6  ALBUMIN 3.2*  --   --   --   --  2.2*  MG  --   --  2.0  --   --  1.9  INR 1.2  --   --   --   --   --   HGBA1C  --  10.8*  --   --   --   --         Radiology Studies: No results found.  Scheduled Meds:  aspirin EC  81 mg Oral Daily   atorvastatin  40 mg Oral QPM   Chlorhexidine Gluconate Cloth  6 each Topical Daily  clopidogrel  75 mg Oral Daily   donepezil  10 mg Oral QHS   finasteride  5 mg Oral Daily   heparin  5,000 Units Subcutaneous Q8H   insulin aspart protamine- aspart  20 Units Subcutaneous BID WC   lactose free nutrition  237 mL Oral TID WC   megestrol  40 mg Oral Daily   tamsulosin  0.4 mg Oral Daily   Continuous Infusions:     LOS: 6 days   Time spent: 30 minutes  Signature  Lala Lund M.D on 03/22/2021 at 10:14 AM   -  To page go to [email or web address]

## 2021-03-23 DIAGNOSIS — N179 Acute kidney failure, unspecified: Secondary | ICD-10-CM | POA: Diagnosis not present

## 2021-03-23 LAB — GLUCOSE, CAPILLARY
Glucose-Capillary: 129 mg/dL — ABNORMAL HIGH (ref 70–99)
Glucose-Capillary: 156 mg/dL — ABNORMAL HIGH (ref 70–99)
Glucose-Capillary: 157 mg/dL — ABNORMAL HIGH (ref 70–99)
Glucose-Capillary: 236 mg/dL — ABNORMAL HIGH (ref 70–99)
Glucose-Capillary: 99 mg/dL (ref 70–99)

## 2021-03-23 MED ORDER — CARVEDILOL 3.125 MG PO TABS: 3.1250 mg | ORAL_TABLET | Freq: Two times a day (BID) | ORAL | Status: AC

## 2021-03-23 MED ORDER — VITAMIN B-12 1000 MCG PO TABS: 1000.0000 ug | ORAL_TABLET | Freq: Every day | ORAL | Status: AC

## 2021-03-23 MED ORDER — BOOST PLUS PO LIQD: 237.0000 mL | Freq: Three times a day (TID) | ORAL | 0 refills | Status: AC

## 2021-03-23 MED ORDER — INSULIN ASPART PROT & ASPART (70-30 MIX) 100 UNIT/ML PEN: 20.0000 [IU] | PEN_INJECTOR | Freq: Two times a day (BID) | SUBCUTANEOUS | 0 refills | Status: AC

## 2021-03-23 MED ORDER — INSULIN ASPART 100 UNIT/ML IJ SOLN
0.0000 [IU] | Freq: Three times a day (TID) | INTRAMUSCULAR | Status: DC
Start: 2021-03-23 — End: 2021-03-24
  Administered 2021-03-23: 3 [IU] via SUBCUTANEOUS

## 2021-03-23 MED ORDER — CARVEDILOL 3.125 MG PO TABS
3.1250 mg | ORAL_TABLET | Freq: Two times a day (BID) | ORAL | Status: DC
  Administered 2021-03-23 – 2021-03-24 (×3): 3.125 mg via ORAL
  Filled 2021-03-23 (×3): qty 1

## 2021-03-23 MED ORDER — TAMSULOSIN HCL 0.4 MG PO CAPS: 0.4000 mg | ORAL_CAPSULE | Freq: Every day | ORAL | Status: AC

## 2021-03-23 MED ORDER — INSULIN ASPART 100 UNIT/ML FLEXPEN: PEN_INJECTOR | SUBCUTANEOUS | 0 refills | Status: AC

## 2021-03-23 NOTE — Discharge Summary (Signed)
Leslie [name] [id number] DOB: [birth year] DOA: 03/16/2021  PCP: Wallene Dales, MD  Admit date: 03/16/2021  Discharge date: 03/23/2021  Admitted From: Home  Disposition:  SNF   Recommendations for Outpatient Follow-up:   Follow up with PCP in 1-2 weeks  PCP Please obtain BMP/CBC, 2 view CXR in 1week,  (see Discharge instructions)   PCP Please follow up on the following pending results: Needs outpatient ENT follow-up in 1 to 2 weeks.   Home Health: None   Equipment/Devices: None  Consultations: None  Discharge Condition: Stable    CODE STATUS: Full    Diet Recommendation: Heart Healthy   Diet Order             Diet Carb Modified Fluid consistency: Thin; Room service appropriate? Yes  Diet effective now                    Chief Complaint  Patient presents with   Code Stroke     Brief history of present illness from the day of admission and additional interim summary    Leslie [name] is a 80 y.o. male with medical history significant for HTN, DMT2, Dementia, CAD who presented from home by EMS for altered mental status.  Reportedly for the last week he has not been eating well and he has not had much to drink.  Upon arrival, he was somnolent, a code stroke was called but work-up was negative.  He was seen by neurology who did not feel the need to be any further work-up.  He was found to have acute renal failure on labs which is suspected be the cause of his altered mental status.  No history of fever or chills.  Wife reports that yesterday he did sit outside on her back deck in the sun for a few hours and was not drinking much.                                                                  Hospital Course     Acute metabolic encephalopathy: CT head, MRI brain, MRA head and neck all negative for  stroke or any other pathology.  Doppler carotid negative for significant stenosis and transthoracic echo with normal ejection fraction and no diastolic dysfunction. AMS was due to dehydration and AKI improved after IV fluids now close to baseline.   Acute kidney injury/dehydration: Due to dehydration however renal ultrasound did show mild left-sided hydronephrosis, has been aggressively hydrated with IV fluids and now has a Foley catheter along with Flomax.  Renal function improving.  Foley removed 03/21/21 - stable.   Essential hypertension: placed on Coreg.   Mild acute hypovolemic hyponatremia: Sodium improved with IVF.   Advanced dementia: Continue Aricept but neurology recommended discontinuing memantine.  Will require SNF.   CAD/hyperlipidemia:  Asymptomatic, continue home medications of aspirin, Plavix and statin.   Asymptomatic bacteriuria: UA was checked last night has positive leukocytes but no nitrites and has some rare bacteria. Monitor.      Type 2 diabetes mellitus: Poor outpatient control due to hyperglycemia A1c 10.8, currently on home dose NovoLog Mix 70/30 20 units, along with SSI, check CBGs q. ACH & HS at SNF.  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  Follow with Primary MD Asres, Alehegn, MD in 7 days   Get CBC, CMP, 2 view Chest X ray -  checked next visit within 1 week by Primary MD or SNF MD   Activity: As tolerated with Full fall precautions use walker/cane & assistance as needed  Disposition SNF  Diet: Heart Healthy Low Carb, check CBGs QAC-HS  Special Instructions: If you have smoked or chewed Tobacco  in the last 2 yrs  please stop smoking, stop any regular Alcohol  and or any Recreational drug use.  On your next visit with your primary care physician please Get Medicines reviewed and adjusted.  Please request your Prim.MD to go over all Hospital Tests and Procedure/Radiological results at the follow up, please get all Hospital records sent to your Prim MD by signing hospital release before you go home.  If you experience worsening of your admission symptoms, develop shortness of breath, life threatening emergency, suicidal or homicidal thoughts you must seek medical attention immediately by calling 911 or calling your MD immediately  if symptoms less severe.  You Must read complete instructions/literature along with all the possible adverse reactions/side effects for all the Medicines you take and that have been prescribed to you. Take any new Medicines after you have completely understood and accpet all the possible adverse reactions/side effects.   Increase activity slowly   Complete by: As directed        Discharge Medications   Allergies as of 03/23/2021   No Known Allergies      Medication List     STOP taking these medications    Cyanocobalamin 2500 MCG Subl Replaced by: vitamin B-12 1000 MCG tablet   doxycycline 100 MG capsule Commonly known as: MONODOX   lisinopril 5 MG tablet Commonly known as: ZESTRIL   memantine 14 MG Cp24 24 hr capsule Commonly known as: NAMENDA XR   metFORMIN 500 MG tablet Commonly known as: GLUCOPHAGE       TAKE these medications    alendronate 70 MG tablet Commonly known as: FOSAMAX Take 70 mg by mouth every Monday.   aspirin EC 81 MG tablet Take 81 mg by mouth daily.   atorvastatin 40 MG tablet Commonly known as: LIPITOR Take 40 mg by mouth every evening.   carvedilol 3.125 MG tablet Commonly known as: COREG Take 1 tablet (3.125 mg total) by mouth 2 (two) times daily with a meal.   clopidogrel 75 MG tablet Commonly known as: PLAVIX Take  75 mg by mouth daily.   donepezil 10 MG tablet Commonly known as: ARICEPT Take 10 mg by mouth at bedtime.   finasteride 5 MG tablet Commonly known as:  PROSCAR Take 5 mg by mouth daily.   gabapentin 300 MG capsule Commonly known as: NEURONTIN Take 300 mg by mouth at bedtime.   insulin aspart 100 UNIT/ML FlexPen Commonly known as: NOVOLOG Before each meal 3 times a day, 140-199 - 2 units, 200-250 - 4 units, 251-299 - 6 units,  300-349 - 8 units,  350 or above 10 units. Insulin PEN if approved, provide syringes and needles if needed.   insulin aspart protamine - aspart (70-30) 100 UNIT/ML FlexPen Commonly known as: NOVOLOG 70/30 MIX Inject 20 Units into the skin 2 (two) times daily with a meal. What changed:  how much to take when to take this additional instructions   lactose free nutrition Liqd Take 237 mLs by mouth 3 (three) times daily with meals.   nitroGLYCERIN 0.4 MG SL tablet Commonly known as: NITROSTAT Place 0.4 mg under the tongue every 5 (five) minutes as needed for chest pain.   silver sulfADIAZINE 1 % cream Commonly known as: SILVADENE Apply 1 application topically 2 (two) times daily.   tamsulosin 0.4 MG Caps capsule Commonly known as: FLOMAX Take 1 capsule (0.4 mg total) by mouth daily.   vitamin B-12 1000 MCG tablet Commonly known as: CYANOCOBALAMIN Take 1 tablet (1,000 mcg total) by mouth daily. Replaces: Cyanocobalamin 2500 MCG Subl         Follow-up Information     Leta Baptist, MD. Schedule an appointment as soon as possible for a visit in 1 week(s).   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 Schedule an appointment as soon as possible for a visit in 1 week(s).   Specialty: Family Medicine Contact information: [id number] [place] [address] [phone]                 Major  procedures and Radiology Reports - PLEASE review detailed and final reports thoroughly  -       MR ANGIO HEAD WO CONTRAST  Result Date: 03/16/2021 CLINICAL DATA:  Initial evaluation for visual disturbance, dizziness, mental status change. EXAM: MRI HEAD WITHOUT CONTRAST MRA HEAD WITHOUT CONTRAST TECHNIQUE: Multiplanar, multi-echo pulse sequences of the brain and surrounding structures were acquired without intravenous contrast. Angiographic images of the Circle of Willis were acquired using MRA technique without intravenous contrast. COMPARISON:  CT from earlier the same day. FINDINGS: MRI HEAD FINDINGS Brain: Diffuse prominence of the CSF containing spaces compatible generalized age-related cerebral atrophy. Scattered patchy T2/FLAIR hyperintensity within the periventricular, deep, and subcortical white matter both cerebral hemispheres noted, most characteristic of chronic microvascular ischemic disease, moderate in nature. No evidence for acute or subacute infarct. Gray-white matter differentiation maintained. No areas of remote cortical infarction. No acute intracranial hemorrhage. Chronic hemosiderin staining seen extending along a cortical sulcus at the high posterior right parietal lobe (series 8, image 85). No acute hemorrhage seen within this region on prior CT. Finding suggest prior subarachnoid hemorrhage at this location. No mass lesion, midline shift or mass effect. No hydrocephalus or extra-axial fluid collection. Pituitary gland within normal limits. Suprasellar region unremarkable. Midline structures intact and normal. Vascular: Major intracranial vascular flow voids are well maintained. Skull and upper cervical spine: Craniocervical junction within normal limits. Bone marrow signal intensity normal. No scalp soft tissue abnormality. Sinuses/Orbits: Prior bilateral ocular lens replacement with sequelae of prior scleral banding on the left. Globes and  orbital soft tissues demonstrate no other  acute finding. Mild mucosal thickening within the ethmoidal air cells. Paranasal sinuses are otherwise clear. Trace bilateral mastoid effusions, of doubtful significance. Visualized nasopharynx unremarkable. Other: 2.7 cm well-circumscribed heterogeneous mass seen within the left parotid gland (series 10, image 20), indeterminate. MRA HEAD FINDINGS Anterior circulation: Visualized distal cervical segments of the internal carotid arteries are patent with antegrade flow. Petrous, cavernous, and supraclinoid segments widely patent without stenosis or other abnormality. Origin of the ophthalmic arteries patent. A1 segments widely patent. Normal anterior communicating artery complex. Anterior cerebral arteries patent to their distal aspects without stenosis. No M1 stenosis or occlusion. Left M1 segments somewhat tortuous and superiorly directed. Normal MCA bifurcations. Distal MCA branches well perfused and symmetric. Posterior circulation: Dominant left vertebral artery widely patent to the vertebrobasilar junction. Right vertebral artery hypoplastic and largely terminates in PICA, although a tiny branch ascending towards the vertebrobasilar junction. Both PICA patent. Basilar patent to its distal aspect without stenosis. Superior cerebral arteries patent bilaterally. Right PCA supplied via the basilar. Fetal type origin left PCA. Both PCAs perfused to their distal aspects without visible stenosis. Anatomic variants: Fetal type origin left PCA. No aneurysm or other vascular abnormality. IMPRESSION: MRI HEAD IMPRESSION: 1. No acute intracranial abnormality. 2. Chronic hemosiderin staining along a cortical sulcus at the high posterior right parietal lobe, suggesting prior subarachnoid hemorrhage at this location. 3. Underlying age-related cerebral atrophy with moderate chronic microvascular ischemic disease. 4. 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 Nonemergent ENT referral suggested for further workup and evaluation.  MRA HEAD IMPRESSION: Negative intracranial MRA. No large vessel occlusion, hemodynamically significant stenosis, or other acute vascular abnormality. Electronically Signed   By: Jeannine Boga M.D.   On: 03/16/2021 20:39   MR BRAIN WO CONTRAST  Result Date: 03/16/2021 CLINICAL DATA:  Initial evaluation for visual disturbance, dizziness, mental status change. EXAM: MRI HEAD WITHOUT CONTRAST MRA HEAD WITHOUT CONTRAST TECHNIQUE: Multiplanar, multi-echo pulse sequences of the brain and surrounding structures were acquired without intravenous contrast. Angiographic images of the Circle of Willis were acquired using MRA technique without intravenous contrast. COMPARISON:  CT from earlier the same day. FINDINGS: MRI HEAD FINDINGS Brain: Diffuse prominence of the CSF containing spaces compatible generalized age-related cerebral atrophy. Scattered patchy T2/FLAIR hyperintensity within the periventricular, deep, and subcortical white matter both cerebral hemispheres noted, most characteristic of chronic microvascular ischemic disease, moderate in nature. No evidence for acute or subacute infarct. Gray-white matter differentiation maintained. No areas of remote cortical infarction. No acute intracranial hemorrhage. Chronic hemosiderin staining seen extending along a cortical sulcus at the high posterior right parietal lobe (series 8, image 85). No acute hemorrhage seen within this region on prior CT. Finding suggest prior subarachnoid hemorrhage at this location. No mass lesion, midline shift or mass effect. No hydrocephalus or extra-axial fluid collection. Pituitary gland within normal limits. Suprasellar region unremarkable. Midline structures intact and normal. Vascular: Major intracranial vascular flow voids are well maintained. Skull and upper cervical spine: Craniocervical junction within normal limits. Bone marrow signal intensity normal. No scalp soft tissue abnormality. Sinuses/Orbits: Prior bilateral ocular  lens replacement with sequelae of prior scleral banding on the left. Globes and orbital soft tissues demonstrate no other acute finding. Mild mucosal thickening within the ethmoidal air cells. Paranasal sinuses are otherwise clear. Trace bilateral mastoid effusions, of doubtful significance. Visualized nasopharynx unremarkable. Other: 2.7 cm well-circumscribed heterogeneous mass seen within the left parotid gland (series 10, image 20), indeterminate. MRA HEAD FINDINGS Anterior circulation: Visualized distal  cervical segments of the internal carotid arteries are patent with antegrade flow. Petrous, cavernous, and supraclinoid segments widely patent without stenosis or other abnormality. Origin of the ophthalmic arteries patent. A1 segments widely patent. Normal anterior communicating artery complex. Anterior cerebral arteries patent to their distal aspects without stenosis. No M1 stenosis or occlusion. Left M1 segments somewhat tortuous and superiorly directed. Normal MCA bifurcations. Distal MCA branches well perfused and symmetric. Posterior circulation: Dominant left vertebral artery widely patent to the vertebrobasilar junction. Right vertebral artery hypoplastic and largely terminates in PICA, although a tiny branch ascending towards the vertebrobasilar junction. Both PICA patent. Basilar patent to its distal aspect without stenosis. Superior cerebral arteries patent bilaterally. Right PCA supplied via the basilar. Fetal type origin left PCA. Both PCAs perfused to their distal aspects without visible stenosis. Anatomic variants: Fetal type origin left PCA. No aneurysm or other vascular abnormality. IMPRESSION: MRI HEAD IMPRESSION: 1. No acute intracranial abnormality. 2. Chronic hemosiderin staining along a cortical sulcus at the high posterior right parietal lobe, suggesting prior subarachnoid hemorrhage at this location. 3. Underlying age-related cerebral atrophy with moderate chronic microvascular ischemic  disease. 4. 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 Nonemergent ENT referral suggested for further workup and evaluation. MRA HEAD IMPRESSION: Negative intracranial MRA. No large vessel occlusion, hemodynamically significant stenosis, or other acute vascular abnormality. Electronically Signed   By: Jeannine Boga M.D.   On: 03/16/2021 20:39   US Renal  Result Date: 03/16/2021 CLINICAL DATA:  Renal failure. EXAM: RENAL / URINARY TRACT ULTRASOUND COMPLETE COMPARISON:  None. FINDINGS: Right Kidney: Renal measurements: 10.7 cm x 5.1 cm x 4.6 cm = volume: 131.59 mL. Echogenicity within normal limits. A 2.3 cm x 2.0 cm x 2.5 cm anechoic structure is seen within the mid right kidney. No abnormal flow is seen within this region on color Doppler evaluation. No hydronephrosis is visualized. Left Kidney: Renal measurements: 9.7 cm x 5.3 cm x 3.9 cm = volume: 103.81 mL. Echogenicity within normal limits. No mass is visualized. Mild left-sided hydronephrosis is seen. Bladder: Appears normal for degree of bladder distention. Other: None. IMPRESSION: 1. Simple right renal cyst. 2. Mild left-sided hydronephrosis. Electronically Signed   By: Virgina Norfolk M.D.   On: 03/16/2021 22:33   ECHOCARDIOGRAM COMPLETE  Result Date: 03/17/2021    ECHOCARDIOGRAM REPORT   Patient Name:   Leslie [name] Date of Exam: 03/17/2021 Medical Rec #:  [id number]     Height:       69.0 in Accession #:    [id number]    Weight:       188.9 lb Date of Birth:  07/19/1941     BSA:          2.017 m Patient Age:    55 years      BP:           152/79 mmHg Patient Gender: M             HR:           95 bpm. Exam Location:  Inpatient Procedure: 2D Echo, Cardiac Doppler, Color Doppler and Intracardiac            Opacification Agent Indications:    TIA  History:        Patient has no prior history of Echocardiogram examinations.                 CAD, TIA and Stroke, Signs/Symptoms:Alzheimer's, Altered Mental  Status and Chest Pain; Risk  Factors:Hypertension, Diabetes and                 Dyslipidemia.  Sonographer:    Roseanna Rainbow RDCS Referring Phys: [id number] [facility]  Sonographer Comments: Technically difficult study due to poor echo windows, suboptimal parasternal window, suboptimal apical window, suboptimal subcostal window and no parasternal window. Exremely difficult echo due to very low respiratory dependent windows. Patient could not follow directions to hold respirations. IMPRESSIONS  1. Very poor quality study due to patient's inability to follow commands. Globally there is normal RV/LV function.  2. Left ventricular ejection fraction, by estimation, is 65 to 70%. The left ventricle has normal function. Left ventricular endocardial border not optimally defined to evaluate regional wall motion. There is mild concentric left ventricular hypertrophy. Left ventricular diastolic function could not be evaluated.  3. Right ventricular systolic function is normal. The right ventricular size is normal. Tricuspid regurgitation signal is inadequate for assessing PA pressure.  4. The mitral valve was not well visualized. No evidence of mitral valve regurgitation.  5. The aortic valve was not well visualized. Aortic valve regurgitation is not visualized. No aortic stenosis is present.  6. The inferior vena cava is normal in size with greater than 50% respiratory variability, suggesting right atrial pressure of 3 mmHg. FINDINGS  Left Ventricle: Left ventricular ejection fraction, by estimation, is 65 to 70%. The left ventricle has normal function. Left ventricular endocardial border not optimally defined to evaluate regional wall motion. Definity contrast agent was given IV to delineate the left ventricular endocardial borders. The left ventricular internal cavity size was normal in size. There is mild concentric left ventricular hypertrophy. Left ventricular diastolic function could not be evaluated due to nondiagnostic images. Left ventricular diastolic  function could not be evaluated. Right Ventricle: The right ventricular size is normal. No increase in right ventricular wall thickness. Right ventricular systolic function is normal. Tricuspid regurgitation signal is inadequate for assessing PA pressure. Left Atrium: Left atrial size was not well visualized. Right Atrium: Right atrial size was not well visualized. Pericardium: Trivial pericardial effusion is present. Presence of pericardial fat pad. Mitral Valve: The mitral valve was not well visualized. No evidence of mitral valve regurgitation. Tricuspid Valve: The tricuspid valve is not well visualized. Tricuspid valve regurgitation is not demonstrated. Aortic Valve: The aortic valve was not well visualized. Aortic valve regurgitation is not visualized. No aortic stenosis is present. Pulmonic Valve: The pulmonic valve was not well visualized. Pulmonic valve regurgitation is not visualized. Aorta: The aortic root and ascending aorta are structurally normal, with no evidence of dilitation. Venous: The inferior vena cava is normal in size with greater than 50% respiratory variability, suggesting right atrial pressure of 3 mmHg. IAS/Shunts: The interatrial septum was not well visualized.  LEFT VENTRICLE PLAX 2D LVIDd:         3.50 cm     Diastology LVIDs:         2.10 cm     LV e' medial:    13.10 cm/s LV PW:         1.10 cm     LV E/e' medial:  4.7 LV IVS:        1.30 cm     LV e' lateral:   5.98 cm/s LVOT diam:     1.90 cm     LV E/e' lateral: 10.3 LV SV:         38 LV SV Index:   19 LVOT  Area:     2.84 cm  LV Volumes (MOD) LV vol d, MOD A4C: 64.9 ml LV vol s, MOD A4C: 24.9 ml LV SV MOD A4C:     64.9 ml RIGHT VENTRICLE             IVC RV S prime:     10.30 cm/s  IVC diam: 1.60 cm LEFT ATRIUM             Index      RIGHT ATRIUM          Index LA diam:        3.00 cm 1.49 cm/m RA Area:     8.02 cm LA Vol (A2C):   11.4 ml 5.65 ml/m RA Volume:   13.70 ml 6.79 ml/m LA Vol (A4C):   7.9 ml  3.92 ml/m LA Biplane  Vol: 10.5 ml 5.21 ml/m  AORTIC VALVE LVOT Vmax:   71.70 cm/s LVOT Vmean:  50.100 cm/s LVOT VTI:    0.134 m  AORTA Ao Root diam: 3.40 cm Ao Asc diam:  3.50 cm MITRAL VALVE MV Area (PHT): 4.68 cm    SHUNTS MV Decel Time: 162 msec    Systemic VTI:  0.13 m MV E velocity: 61.70 cm/s  Systemic Diam: 1.90 cm MV A velocity: 99.00 cm/s MV E/A ratio:  0.62 Eleonore Chiquito MD Electronically signed by Eleonore Chiquito MD Signature Date/Time: 03/17/2021/12:50:03 PM    Final    CT HEAD CODE STROKE WO CONTRAST  Result Date: 03/16/2021 CLINICAL DATA:  Code stroke. Acute neuro deficit. Confusion, facial droop. EXAM: CT HEAD WITHOUT CONTRAST TECHNIQUE: Contiguous axial images were obtained from the base of the skull through the vertex without intravenous contrast. COMPARISON:  None. FINDINGS: Brain: Generalized atrophy. Patchy white matter hypodensity bilaterally likely chronic. Negative for hydrocephalus. Negative for acute infarct, hemorrhage, mass. Vascular: Negative for hyperdense vessel. Skull: Negative Sinuses/Orbits: Negative Other: Motion degraded study ASPECTS ([place] Stroke Program Early CT Score) - Ganglionic level infarction (caudate, lentiform nuclei, internal capsule, insula, M1-M3 cortex): 7 - Supraganglionic infarction (M4-M6 cortex): 3 Total score (0-10 with 10 being normal): 10 IMPRESSION: 1. No acute abnormality. 2. Motion degraded study 3. ASPECTS is 10 4. Code stroke imaging results were communicated on 03/16/2021 at 6:52 pm to provider [place] via text page Electronically Signed   By: Franchot Gallo M.D.   On: 03/16/2021 18:53   VAS US CAROTID  Result Date: 03/21/2021 Carotid Arterial Duplex Study Patient Name:  Leslie [name]  Date of Exam:   03/17/2021 Medical Rec #: [id number]      Accession #:    [id number] Date of Birth: Jan 15, 1941      Patient Gender: M Patient Age:   79Y Exam Location:  [facility] Procedure:      VAS US CAROTID Referring Phys: [id number] Rosalin Hawking  --------------------------------------------------------------------------------  Indications:       TIA. Risk Factors:      Hypertension, hyperlipidemia, Diabetes, past history of                    smoking, coronary artery disease. Other Factors:     Dementia. Limitations        Today's exam was limited due to suboptimal scanning                    environment. Comparison Study:  No prior studies. Performing Technologist: Darlin Coco RDMS,RVT  Examination Guidelines: A complete evaluation includes B-mode imaging, spectral Doppler, color Doppler,  and power Doppler as needed of all accessible portions of each vessel. Bilateral testing is considered an integral part of a complete examination. Limited examinations for reoccurring indications may be performed as noted.  Right Carotid Findings: +----------+--------+--------+--------+-------------------------+--------+           PSV cm/sEDV cm/sStenosisPlaque Description       Comments +----------+--------+--------+--------+-------------------------+--------+ CCA Prox  57      11                                                +----------+--------+--------+--------+-------------------------+--------+ CCA Distal66      10                                                +----------+--------+--------+--------+-------------------------+--------+ ICA Prox  86      13      1-39%   calcific and heterogenous         +----------+--------+--------+--------+-------------------------+--------+ ICA Distal93      20                                                +----------+--------+--------+--------+-------------------------+--------+ ECA       94      14                                                +----------+--------+--------+--------+-------------------------+--------+ +----------+--------+-------+----------------+-------------------+           PSV cm/sEDV cmsDescribe        Arm Pressure (mmHG)  +----------+--------+-------+----------------+-------------------+ Subclavian100            Multiphasic, WNL                    +----------+--------+-------+----------------+-------------------+ +---------+--------+--+--------+-+---------+ VertebralPSV cm/s52EDV cm/s8Antegrade +---------+--------+--+--------+-+---------+  Left Carotid Findings: +----------+--------+--------+--------+-------------------------+--------+           PSV cm/sEDV cm/sStenosisPlaque Description       Comments +----------+--------+--------+--------+-------------------------+--------+ CCA Prox  93      11              heterogenous and smooth           +----------+--------+--------+--------+-------------------------+--------+ CCA Distal62      13                                                +----------+--------+--------+--------+-------------------------+--------+ ICA Prox  66      12      1-39%   calcific and heterogenous         +----------+--------+--------+--------+-------------------------+--------+ ICA Distal53      13                                                +----------+--------+--------+--------+-------------------------+--------+ ECA  130     13                                                +----------+--------+--------+--------+-------------------------+--------+ +----------+--------+--------+----------------+-------------------+           PSV cm/sEDV cm/sDescribe        Arm Pressure (mmHG) +----------+--------+--------+----------------+-------------------+ [id number]              Multiphasic, WNL                    +----------+--------+--------+----------------+-------------------+ +---------+--------+--+--------+--+---------+ VertebralPSV cm/s43EDV cm/s13Antegrade +---------+--------+--+--------+--+---------+   Summary: Right Carotid: Velocities in the right ICA are consistent with a 1-39% stenosis. Left Carotid: Velocities in the left ICA are  consistent with a 1-39% stenosis.               Non-hemodynamically significant plaque <50% noted in the CCA. Vertebrals:  Bilateral vertebral arteries demonstrate antegrade flow. Subclavians: Normal flow hemodynamics were seen in bilateral subclavian              arteries. *See table(s) above for measurements and observations.  Electronically signed by Antony Contras MD on 03/21/2021 at 11:17:03 AM.    Final     Micro Results     Recent Results (from the past 240 hour(s))  Resp Panel by RT-PCR (Flu A&B, Covid) Nasopharyngeal Swab     Status: None   Collection Time: 03/16/21  7:35 PM   Specimen: Nasopharyngeal Swab; Nasopharyngeal(NP) swabs in vial transport medium  Result Value Ref Range Status   SARS Coronavirus 2 by RT PCR NEGATIVE NEGATIVE Final    Comment: (NOTE) SARS-CoV-2 target nucleic acids are NOT DETECTED.  The SARS-CoV-2 RNA is generally detectable in upper respiratory specimens during the acute phase of infection. The lowest concentration of SARS-CoV-2 viral copies this assay can detect is 138 copies/mL. A negative result does not preclude SARS-Cov-2 infection and should not be used as the sole basis for treatment or other patient management decisions. A negative result may occur with  improper specimen collection/handling, submission of specimen other than nasopharyngeal swab, presence of viral mutation(s) within the areas targeted by this assay, and inadequate number of viral copies(<138 copies/mL). A negative result must be combined with clinical observations, patient history, and epidemiological information. The expected result is Negative.  Fact Sheet for Patients:  EntrepreneurPulse.com.au  Fact Sheet for Healthcare Providers:  IncredibleEmployment.be  This test is no t yet approved or cleared by the [place] FDA and  has been authorized for detection and/or diagnosis of SARS-CoV-2 by FDA under an Emergency Use Authorization  (EUA). This EUA will remain  in effect (meaning this test can be used) for the duration of the COVID-19 declaration under Section 564(b)(1) of the Act, 21 U.S.C.section 360bbb-3(b)(1), unless the authorization is terminated  or revoked sooner.       Influenza A by PCR NEGATIVE NEGATIVE Final   Influenza B by PCR NEGATIVE NEGATIVE Final    Comment: (NOTE) The Xpert Xpress SARS-CoV-2/FLU/RSV plus assay is intended as an aid in the diagnosis of influenza from Nasopharyngeal swab specimens and should not be used as a sole basis for treatment. Nasal washings and aspirates are unacceptable for Xpert Xpress SARS-CoV-2/FLU/RSV testing.  Fact Sheet for Patients: EntrepreneurPulse.com.au  Fact Sheet for Healthcare Providers: IncredibleEmployment.be  This test is not yet approved or cleared by the [place] FDA  and has been authorized for detection and/or diagnosis of SARS-CoV-2 by FDA under an Emergency Use Authorization (EUA). This EUA will remain in effect (meaning this test can be used) for the duration of the COVID-19 declaration under Section 564(b)(1) of the Act, 21 U.S.C. section 360bbb-3(b)(1), unless the authorization is terminated or revoked.  Performed at [facility], [place] [address], [place], [place] [address]     Today   Subjective    Leslie [name] today has no headache,no chest abdominal pain,no new weakness tingling or numbness, feels much better.   Objective   Blood pressure 145/90, pulse 90, temperature 97.8 F (36.6 C), temperature source Oral, resp. rate 18, height '5\' 9"'$  (1.753 m), weight 81 kg, SpO2 98 %.   Intake/Output Summary (Last 24 hours) at 03/23/2021 1032 Last data filed at 03/23/2021 0700 Gross per 24 hour  Intake 165 ml  Output 750 ml  Net -585 ml    Exam  Awake Alert x 1, No new F.N deficits,   [place].AT,PERRAL Supple Neck,No JVD, No cervical lymphadenopathy appriciated.  Symmetrical Chest wall  movement, Good air movement bilaterally, CTAB RRR,No Gallops,Rubs or new Murmurs, No Parasternal Heave +ve B.Sounds, Abd Soft, Non tender, No organomegaly appriciated, No rebound -guarding or rigidity. No Cyanosis, Clubbing or edema, No new Rash or bruise   Data Review   CBC w Diff:  Lab Results  Component Value Date   WBC 6.0 03/20/2021   HGB 10.8 (L) 03/20/2021   HCT 29.8 (L) 03/20/2021   PLT 144 (L) 03/20/2021   LYMPHOPCT 8 03/20/2021   MONOPCT 9 03/20/2021   EOSPCT 2 03/20/2021   BASOPCT 1 03/20/2021    CMP:  Lab Results  Component Value Date   NA 140 03/20/2021   K 4.1 03/20/2021   CL 109 03/20/2021   CO2 23 03/20/2021   BUN 32 (H) 03/20/2021   CREATININE 2.12 (H) 03/20/2021   PROT 5.2 (L) 03/20/2021   ALBUMIN 2.2 (L) 03/20/2021   BILITOT 0.6 03/20/2021   ALKPHOS 45 03/20/2021   AST 31 03/20/2021   ALT 27 03/20/2021  .   Total Time in preparing paper work, data evaluation and todays exam - 53 minutes  Lala Lund M.D on 03/23/2021 at 10:32 AM  [facility]

## 2021-03-23 NOTE — Progress Notes (Signed)
Physical Therapy Treatment Patient Details Name: Leslie [name] MRN: [id number] DOB: 10-09-1940 Today's Date: 03/23/2021    History of Present Illness Pt is an 80 y.o. male admitted 7/27 with acute renal failure and AMS. PMH:  HTN, DMT2, Dementia, CAD    PT Comments    Pt tolerates increased ambulation distances this session. Pt demonstrates continued generalized weakness in lower extremities and benefits from assistance/supervision to maintain safety when mobilizing. Pt continues to demonstrate significant cognitive deficits due to baseline dementia which also raises safety concerns. Pt will benefit from continued mobilization with PT services to improve activity tolerance and reduce caregiver burden. PT continues to recommend SNF placement.  Follow Up Recommendations  SNF     Equipment Recommendations  Wheelchair (measurements PT)    Recommendations for Other Services       Precautions / Restrictions Precautions Precautions: Fall Restrictions Weight Bearing Restrictions: No    Mobility  Bed Mobility Overal bed mobility: Modified Independent             General bed mobility comments: not assessed    Transfers Overall transfer level: Needs assistance Equipment used: Rolling walker (2 wheeled) Transfers: Sit to/from Stand Sit to Stand: Min guard         General transfer comment: pt rocking to gain momentum, demonstrates good hand placement during transfers  Ambulation/Gait Ambulation/Gait assistance: Min guard Gait Distance (Feet): 125 Feet (additional trials of 75 and 50') Assistive device: Rolling walker (2 wheeled) Gait Pattern/deviations: Step-through pattern Gait velocity: reduced Gait velocity interpretation: <1.8 ft/sec, indicate of risk for recurrent falls General Gait Details: pt with slowed step-throug gait, mild increase in trunk flexion   Stairs             Wheelchair Mobility    Modified Rankin (Stroke Patients Only)       Balance  Overall balance assessment: Needs assistance Sitting-balance support: No upper extremity supported;Feet supported Sitting balance-Leahy Scale: Good     Standing balance support: Bilateral upper extremity supported Standing balance-Leahy Scale: Poor Standing balance comment: reliant on external support                            Cognition Arousal/Alertness: Awake/alert Behavior During Therapy: WFL for tasks assessed/performed Overall Cognitive Status: History of cognitive impairments - at baseline                                 General Comments: per family cognition is at baseline.      Exercises      General Comments General comments (skin integrity, edema, etc.): VSS on RA      Pertinent Vitals/Pain Pain Assessment: Faces Faces Pain Scale: Hurts a little bit Pain Location: LLE Pain Descriptors / Indicators: Grimacing Pain Intervention(s): Monitored during session    Home Living                      Prior Function            PT Goals (current goals can now be found in the care plan section) Acute Rehab PT Goals Patient Stated Goal: none stated Progress towards PT goals: Progressing toward goals    Frequency    Min 2X/week      PT Plan Current plan remains appropriate    Co-evaluation              AM-PAC PT "  6 Clicks" Mobility   Outcome Measure  Help needed turning from your back to your side while in a flat bed without using bedrails?: A Little Help needed moving from lying on your back to sitting on the side of a flat bed without using bedrails?: A Lot Help needed moving to and from a bed to a chair (including a wheelchair)?: A Little Help needed standing up from a chair using your arms (e.g., wheelchair or bedside chair)?: A Little Help needed to walk in hospital room?: A Little Help needed climbing 3-5 steps with a railing? : A Lot 6 Click Score: 16    End of Session   Activity Tolerance: Patient  tolerated treatment well Patient left: in chair;with call bell/phone within reach;with chair alarm set;with family/visitor present Nurse Communication: Mobility status PT Visit Diagnosis: Muscle weakness (generalized) (M62.81);Pain     Time: [id number] PT Time Calculation (min) (ACUTE ONLY): 27 min  Charges:  $Gait Training: 8-22 mins $Therapeutic Activity: 8-22 mins                     Zenaida Niece, PT, DPT Acute Rehabilitation Pager: [phone]    Zenaida Niece 03/23/2021, 4:23 PM

## 2021-03-23 NOTE — Progress Notes (Signed)
Occupational Therapy Treatment Patient Details Name: Oley Dallis MRN: [id number] DOB: [birth year] Today's Date: 03/23/2021    History of present illness Pt is an 80 y.o. male admitted 7/27 with acute renal failure and AMS. PMH:  HTN, DMT2, Dementia, CAD   OT comments  Pt making incremental progress this session with OT goals. He is able to complete bed mobility independently with HOB slightly elevated. Once seated, pt can complete all seated ADL's with set up and verbal cuing for sequencing. Pt then transferred to recliner across the room with min A to power up to standing. OT will continue to follow acutely.    Follow Up Recommendations  SNF    Equipment Recommendations  None recommended by OT    Recommendations for Other Services      Precautions / Restrictions Precautions Precautions: Fall Restrictions Weight Bearing Restrictions: Yes       Mobility Bed Mobility Overal bed mobility: Modified Independent             General bed mobility comments: HOB elevated, increased time meeded.    Transfers Overall transfer level: Needs assistance Equipment used: Rolling walker (2 wheeled) Transfers: Sit to/from Stand Sit to Stand: From elevated surface;Min assist              Balance Overall balance assessment: Mild deficits observed, not formally tested Sitting-balance support: Feet supported;No upper extremity supported Sitting balance-Leahy Scale: Good     Standing balance support: Bilateral upper extremity supported;During functional activity Standing balance-Leahy Scale: Poor Standing balance comment: reliant on external support                           ADL either performed or assessed with clinical judgement   ADL Overall ADL's : Needs assistance/impaired Eating/Feeding: Set up;Sitting   Grooming: Min guard   Upper Body Bathing: Min guard   Lower Body Bathing: Moderate assistance   Upper Body Dressing : Independent   Lower Body  Dressing: Minimal assistance   Toilet Transfer: Minimal assistance   Toileting- Clothing Manipulation and Hygiene: Minimal assistance   Tub/ Shower Transfer: Minimal assistance   Functional mobility during ADLs: Minimal assistance       Vision   Vision Assessment?: No apparent visual deficits   Perception     Praxis      Cognition Arousal/Alertness: Awake/alert Behavior During Therapy: WFL for tasks assessed/performed Overall Cognitive Status: History of cognitive impairments - at baseline                                 General Comments: per family cognition is at baseline.        Exercises     Shoulder Instructions       General Comments VSS on RA    Pertinent Vitals/ Pain       Pain Assessment: No/denies pain  Home Living                                          Prior Functioning/Environment              Frequency  Min 2X/week        Progress Toward Goals  OT Goals(current goals can now be found in the care plan section)  Progress towards OT goals: Progressing toward goals  Acute  Rehab OT Goals Patient Stated Goal: "I just want to go home" OT Goal Formulation: With patient Time For Goal Achievement: 04/02/21 Potential to Achieve Goals: Good ADL Goals Pt Will Perform Grooming: with supervision;standing Pt Will Perform Upper Body Bathing: with set-up;standing;sitting Pt Will Perform Upper Body Dressing: with set-up;sitting Pt Will Transfer to Toilet: with supervision;ambulating;regular height toilet Pt Will Perform Toileting - Clothing Manipulation and hygiene: with min guard assist;sit to/from stand Pt/caregiver will Perform Home Exercise Program: Increased strength;Both right and left upper extremity;With theraband;With Supervision  Plan Discharge plan remains appropriate;Frequency remains appropriate    Co-evaluation                 AM-PAC OT "6 Clicks" Daily Activity     Outcome Measure    Help from another person eating meals?: None Help from another person taking care of personal grooming?: A Little Help from another person toileting, which includes using toliet, bedpan, or urinal?: A Little Help from another person bathing (including washing, rinsing, drying)?: A Lot Help from another person to put on and taking off regular upper body clothing?: A Little Help from another person to put on and taking off regular lower body clothing?: A Little 6 Click Score: 18    End of Session Equipment Utilized During Treatment: Gait belt;Rolling walker  OT Visit Diagnosis: Unsteadiness on feet (R26.81);Muscle weakness (generalized) (M62.81);Other symptoms and signs involving cognitive function;Dizziness and giddiness (R42)   Activity Tolerance Patient tolerated treatment well   Patient Left in chair;with bed alarm set   Nurse Communication Mobility status        Time: [id number] OT Time Calculation (min): 23 min  Charges: OT General Charges $OT Visit: 1 Visit OT Treatments $Self Care/Home Management : 23-37 mins  Oluwanifemi Petitti H., OTR/L Acute Rehabilitation  Kiela Shisler Elane Marcile Fuquay 03/23/2021, 3:58 PM

## 2021-03-24 DIAGNOSIS — N179 Acute kidney failure, unspecified: Secondary | ICD-10-CM | POA: Diagnosis not present

## 2021-03-24 LAB — SARS CORONAVIRUS 2 (TAT 6-24 HRS): SARS Coronavirus 2: NEGATIVE

## 2021-03-24 LAB — GLUCOSE, CAPILLARY
Glucose-Capillary: 118 mg/dL — ABNORMAL HIGH (ref 70–99)
Glucose-Capillary: 120 mg/dL — ABNORMAL HIGH (ref 70–99)

## 2021-03-24 NOTE — Progress Notes (Signed)
PTAR here for transport to [facility], family aware and report has been given.

## 2021-03-24 NOTE — Progress Notes (Signed)
Covid test sent to lab.

## 2021-03-24 NOTE — Progress Notes (Signed)
[facility]                                                                                                                                                                         Patient Demographics  Leslie [name], is a 80 y.o. male  [id number]  [id number]  DOB - Feb 12, 1941  Admit date - 03/16/2021  Admitting Physician Eben Burow, MD  Outpatient Primary MD for the patient is Wallene Dales, MD  LOS - 8   Chief Complaint  Patient presents with   Code Stroke        Assessment & Plan    Patient seen briefly today due for discharge soon per Discharge done yesterday by me no further issues, Vital signs stable, patient feels fine, await SNF bed.    Medications  Scheduled Meds:  aspirin EC  81 mg Oral Daily   atorvastatin  40 mg Oral QPM   carvedilol  3.125 mg Oral BID WC   clopidogrel  75 mg Oral Daily   donepezil  10 mg Oral QHS   finasteride  5 mg Oral Daily   heparin  5,000 Units Subcutaneous Q8H   insulin aspart  0-9 Units Subcutaneous TID WC & HS   insulin aspart protamine- aspart  20 Units Subcutaneous BID WC   lactose free nutrition  237 mL Oral TID WC   megestrol  40 mg Oral Daily   tamsulosin  0.4 mg Oral Daily   Continuous Infusions: PRN Meds:.acetaminophen **OR** [DISCONTINUED] acetaminophen, hydrALAZINE, labetalol, nitroGLYCERIN, [DISCONTINUED] ondansetron **OR** ondansetron (ZOFRAN) IV    Time Spent in minutes   10 minutes   Lala Lund M.D on 03/24/2021 at 9:17 AM  Between 7am to 7pm - Pager - [phone]  After 7pm go to [email or web address] - password [email or web address]  And look for the night coverage person covering for me after hours  [facility] Office  [phone]    Subjective:   Jakwan Mindel today has, No headache, No chest pain, No abdominal pain - No Nausea, No new weakness tingling or numbness, No Cough - SOB.    Objective:   Vitals:    03/23/21 0340 03/23/21 2015 03/24/21 0101 03/24/21 0751  BP: (!) 151/88 135/75 136/72 105/66  Pulse: 98 81 84 96  Resp: '18 18  18  '$ Temp: 97.8 F (36.6 C) (!) 97.4 F (36.3 C) 98.2 F (36.8 C) 97.9 F (36.6 C)  TempSrc: Oral Oral Oral Oral  SpO2: 98% 100% 99% 96%  Weight:      Height:        Wt Readings from Last 3 Encounters:  03/22/21 81 kg  09/01/15 85.7 kg     Intake/Output Summary (Last 24 hours) at 03/24/2021 0917 Last  data filed at 03/24/2021 0500 Gross per 24 hour  Intake 440 ml  Output 225 ml  Net 215 ml    Exam Awake   Oriented X 1, No new F.N deficits  [place].AT,PERRAL Supple Neck,No JVD, No cervical lymphadenopathy appriciated.  Symmetrical Chest wall movement, Good air movement bilaterally, CTAB RRR,No Gallops,Rubs or new Murmurs, No Parasternal Heave +ve B.Sounds, Abd Soft, Non tender, No organomegaly appriciated, No rebound - guarding or rigidity. No Cyanosis, Clubbing or edema, No new Rash or bruise      Data Review

## 2021-03-24 NOTE — Progress Notes (Signed)
Report given to nurse at [facility] for pt. Will await PTAR for transport.

## 2021-03-24 NOTE — TOC Transition Note (Addendum)
Transition of Care [facility]) - CM/SW Discharge Note   Patient Details  Name: Leslie [name] MRN: [id number] Date of Birth: [birth year]  [facility] CM/SW Contact:  Emeterio Reeve, LCSW Phone Number: 03/24/2021, 12:19 PM   Clinical Narrative:     Patient will DC to: [facility] Anticipated DC date: 03/24/21 Family notified: wife Transport by: Corey Harold     Per MD patient ready for DC to [facility]. RN, patient, patient's family, and facility notified of DC. Discharge Summary and FL2 sent to facility.DC packet on chart. Insurance Josem Kaufmann is approved. Pt is covid test is pending, Andree Elk form can accept pending results. Ambulance transport requested for patient.    RN to call report to [phone].  CSW will sign off for now as social work intervention is no longer needed. Please consult [place] again if new needs arise.   Final next level of care: Skilled Nursing Facility Barriers to Discharge: Barriers Resolved   Patient Goals and CMS Choice Patient states their goals for this hospitalization and ongoing recovery are:: to get better CMS Medicare.gov Compare Post Acute Care list provided to:: Patient Choice offered to / list presented to : Spouse  Discharge Placement              Patient chooses bed at: [facility] Patient to be transferred to facility by: Ptar Name of family member notified: Wife Patient and family notified of of transfer: 03/24/21  Discharge Plan and Services                                     Social Determinants of Health (SDOH) Interventions     Readmission Risk Interventions No flowsheet data found.   Emeterio Reeve, Latanya Presser, [place] Social Worker [phone]

## 2022-07-21 DEATH — deceased
